# Patient Record
Sex: Male | Born: 1950 | Race: White | Hispanic: No | Marital: Married | Smoking: Current every day smoker
Health system: Southern US, Community
[De-identification: ages and names within clinical notes are randomized; demographics above are authoritative.]

---

## 2002-07-28 ENCOUNTER — Ambulatory Visit (HOSPITAL_COMMUNITY): Admission: RE | Admit: 2002-07-28 | Discharge: 2002-07-28 | Payer: Self-pay | Admitting: Neurosurgery

## 2002-07-28 ENCOUNTER — Encounter: Payer: Self-pay | Admitting: Neurosurgery

## 2007-11-07 ENCOUNTER — Encounter: Admission: RE | Admit: 2007-11-07 | Discharge: 2007-11-25 | Payer: Self-pay | Admitting: Family Medicine

## 2010-07-14 ENCOUNTER — Other Ambulatory Visit: Payer: Self-pay | Admitting: Neurosurgery

## 2010-07-14 DIAGNOSIS — M545 Low back pain: Secondary | ICD-10-CM

## 2010-07-18 ENCOUNTER — Ambulatory Visit
Admission: RE | Admit: 2010-07-18 | Discharge: 2010-07-18 | Disposition: A | Discharge status: Home / Self Care | Payer: 59 | Source: Ambulatory Visit | Attending: Neurosurgery | Admitting: Neurosurgery

## 2010-07-18 DIAGNOSIS — M545 Low back pain: Secondary | ICD-10-CM

## 2019-07-05 ENCOUNTER — Ambulatory Visit: Payer: Self-pay | Attending: Internal Medicine

## 2019-07-05 DIAGNOSIS — Z23 Encounter for immunization: Secondary | ICD-10-CM | POA: Insufficient documentation

## 2019-07-05 NOTE — Progress Notes (Signed)
   Covid-19 Vaccination Clinic  Name:  Zachary Terry    MRN: [id number] DOB: [birth year]  07/05/2019  Mr. Zachary Terry was observed post Covid-19 immunization for 15 minutes without incidence. He was provided with Vaccine Information Sheet and instruction to access the V-Safe system.   Mr. Zachary Terry was instructed to call 911 with any severe reactions post vaccine: Marland Kitchen Difficulty breathing  . Swelling of your face and throat  . A fast heartbeat  . A bad rash all over your body  . Dizziness and weakness    Immunizations Administered    Name Date Dose VIS Date Route   Pfizer COVID-19 Vaccine 07/05/2019  3:28 PM 0.3 mL 05/23/2019 Intramuscular   Manufacturer: [employer], [employer]   Lot: [id number]   NDC: [address]-7

## 2019-07-28 ENCOUNTER — Ambulatory Visit: Payer: Self-pay | Attending: Internal Medicine

## 2019-07-28 DIAGNOSIS — Z23 Encounter for immunization: Secondary | ICD-10-CM | POA: Insufficient documentation

## 2019-07-28 NOTE — Progress Notes (Signed)
   Covid-19 Vaccination Clinic  Name:  VALOR QUAINTANCE    MRN: [id number] DOB: [birth year]  07/28/2019  Mr. Berthelot was observed post Covid-19 immunization for 15 minutes without incidence. He was provided with Vaccine Information Sheet and instruction to access the V-Safe system.   Mr. Malcomb was instructed to call 911 with any severe reactions post vaccine: Marland Kitchen Difficulty breathing  . Swelling of your face and throat  . A fast heartbeat  . A bad rash all over your body  . Dizziness and weakness    Immunizations Administered    Name Date Dose VIS Date Route   Pfizer COVID-19 Vaccine 07/28/2019 11:16 AM 0.3 mL 05/23/2019 Intramuscular   Manufacturer: [employer], [employer]   Lot: [id number]   NDC: [address]-9

## 2019-08-25 ENCOUNTER — Other Ambulatory Visit: Payer: Self-pay | Admitting: Family Medicine

## 2019-08-26 ENCOUNTER — Telehealth: Payer: Self-pay

## 2019-08-26 ENCOUNTER — Other Ambulatory Visit: Payer: Self-pay | Admitting: Family Medicine

## 2019-08-26 NOTE — Telephone Encounter (Signed)
NOTES ON FILE FROM [facility] [phone], REFERRAL SENT TO SCHEDULING

## 2019-09-01 ENCOUNTER — Other Ambulatory Visit: Payer: Self-pay | Admitting: Family Medicine

## 2019-09-01 DIAGNOSIS — Z0000 Encounter for general adult medical examination without abnormal findings: Secondary | ICD-10-CM

## 2019-09-17 ENCOUNTER — Ambulatory Visit: Payer: Self-pay

## 2019-09-19 ENCOUNTER — Ambulatory Visit
Admission: RE | Admit: 2019-09-19 | Discharge: 2019-09-19 | Disposition: A | Discharge status: Home / Self Care | Payer: Medicare Other | Source: Ambulatory Visit | Attending: Family Medicine | Admitting: Family Medicine

## 2019-09-19 DIAGNOSIS — Z0000 Encounter for general adult medical examination without abnormal findings: Secondary | ICD-10-CM

## 2019-12-05 ENCOUNTER — Other Ambulatory Visit: Payer: Self-pay

## 2019-12-05 ENCOUNTER — Encounter: Payer: Self-pay | Admitting: Cardiology

## 2019-12-05 ENCOUNTER — Ambulatory Visit: Payer: Medicare Other | Admitting: Cardiology

## 2019-12-05 ENCOUNTER — Ambulatory Visit
Admission: RE | Admit: 2019-12-05 | Discharge: 2019-12-05 | Disposition: A | Discharge status: Home / Self Care | Payer: Medicare Other | Source: Ambulatory Visit | Attending: Cardiology | Admitting: Cardiology

## 2019-12-05 VITALS — BP 166/96 | HR 78 | Ht 71.5 in | Wt 181.0 lb

## 2019-12-05 DIAGNOSIS — I714 Abdominal aortic aneurysm, without rupture, unspecified: Secondary | ICD-10-CM

## 2019-12-05 DIAGNOSIS — I10 Essential (primary) hypertension: Secondary | ICD-10-CM

## 2019-12-05 DIAGNOSIS — F17200 Nicotine dependence, unspecified, uncomplicated: Secondary | ICD-10-CM

## 2019-12-05 DIAGNOSIS — E785 Hyperlipidemia, unspecified: Secondary | ICD-10-CM

## 2019-12-05 MED ORDER — VALSARTAN 80 MG PO TABS: 80.0000 mg | ORAL_TABLET | Freq: Every day | ORAL | 1 refills | Status: DC

## 2019-12-05 MED ORDER — ROSUVASTATIN CALCIUM 5 MG PO TABS: 5.0000 mg | ORAL_TABLET | Freq: Every day | ORAL | 1 refills | Status: DC

## 2019-12-05 NOTE — Patient Instructions (Signed)
Medication Instructions:   START TAKING VALSARTAN 80 MG BY MOUTH DAILY  START TAKING ROSUVASTATIN 5 MG BY MOUTH DAILY  *If you need a refill on your cardiac medications before your next appointment, please call your pharmacy*   Lab Work:  IN 4 WEEKS HERE AT THE OFFICE TO CHECK--CMET AND LIPIDS--PLEASE COME FASTING TO THIS LAB APPOINTMENT  If you have labs (blood work) drawn today and your tests are completely normal, you will receive your results only by: Marland Kitchen MyChart Message (if you have MyChart) OR . A paper copy in the mail If you have any lab test that is abnormal or we need to change your treatment, we will call you to review the results.  Testing/Procedures:  Your physician has requested that you have an echocardiogram. Echocardiography is a painless test that uses sound waves to create images of your heart. It provides your doctor with information about the size and shape of your heart and how well your heart's chambers and valves are working. This procedure takes approximately one hour. There are no restrictions for this procedure.  CT CALCIUM SCORE TO BE DONE HERE IN THE OFFICE TODAY--OK PER CT TO HAVE THIS DONE AFTER CHECKOUT COMPLETE   Follow-Up:  3 MONTHS IN THE OFFICE TO SEE DR. Delton See

## 2019-12-05 NOTE — Progress Notes (Signed)
Cardiology Office Note:    Date:  12/05/2019   ID:  Zachary Terry, DOB [birth year], MRN [id number]  PCP:  Lawerance Cruel, MD  [place] Cardiologist:  No primary care provider on file.  [facility] Electrophysiologist:  None   Referring MD: Lawerance Cruel, MD   Reason for visit: Hypertension, hyperlipidemia newly diagnosed AAA  History of Present Illness:    Zachary Terry is a 69 y.o. male with a hx of lifelong smoking, previously cigarettes now few cigars a day.  Hyperlipidemia and hypertension who was recently screened for AAA and was found to have ectatic abdominal aorta with diffuse atherosclerotic plaque.  The patient is very active he just recently retired but keeps playing golf several times a week and has no symptoms of chest pain or shortness of breath.  He denies any dizziness palpitations or syncope.  He has never been treated for high blood pressure but previously was on Lipitor and Crestor and tolerated well but did not want to be on long-term statins.  He has no family history of premature coronary artery disease or sudden cardiac death.  His father was 36 he still alive and playing 18 holes several times a week.  He has atrial fibrillation.  Past medical history: Hypertension hyperlipidemia  Current Medications: Current Meds  Medication Sig  . Ascorbic Acid (VITAMIN C PO) Take 1 tablet by mouth daily.  Marland Kitchen aspirin EC 81 MG tablet Take 81 mg by mouth daily. Swallow whole.  . milk thistle 175 MG tablet Take 175 mg by mouth daily.  . sildenafil (REVATIO) 20 MG tablet Take 20 mg by mouth as needed.  Marland Kitchen VITAMIN D PO Take 1 tablet by mouth daily.     Allergies:   Patient has no known allergies.   Social History   Socioeconomic History  . Marital status: Married    Spouse name: Not on file  . Number of children: Not on file  . Years of education: Not on file  . Highest education level: Not on file  Occupational History  . Not on file  Tobacco Use  .  Smoking status: Current Every Day Smoker    Types: Cigars  . Smokeless tobacco: Never Used  Substance and Sexual Activity  . Alcohol use: Not on file  . Drug use: Not on file  . Sexual activity: Not on file  Other Topics Concern  . Not on file  Social History Narrative  . Not on file   Social Determinants of Health   Financial Resource Strain:   . Difficulty of Paying Living Expenses:   Food Insecurity:   . Worried About Charity fundraiser in the Last Year:   . Arboriculturist in the Last Year:   Transportation Needs:   . Film/video editor (Medical):   Marland Kitchen Lack of Transportation (Non-Medical):   Physical Activity:   . Days of Exercise per Week:   . Minutes of Exercise per Session:   Stress:   . Feeling of Stress :   Social Connections:   . Frequency of Communication with Friends and Family:   . Frequency of Social Gatherings with Friends and Family:   . Attends Religious Services:   . Active Member of Clubs or Organizations:   . Attends Archivist Meetings:   Marland Kitchen Marital Status:      Family History: The patient's family history includes atrial fibrillation in his father no family history of premature coronary artery disease.  ROS:   Please see the history of present illness.    All other systems reviewed and are negative.  EKGs/Labs/Other Studies Reviewed:    The following studies were reviewed today: EKG:  EKG is ordered today.  The ekg ordered today demonstrates sinus rhythm, nonspecific ST-T wave abnormalities otherwise normal EKG.  Is in the reviewed.  Recent Labs: No results found for requested labs within last 8760 hours.  Recent Lipid Panel No results found for: CHOL, TRIG, HDL, CHOLHDL, VLDL, LDLCALC, LDLDIRECT  Physical Exam:    VS:  BP (!) 166/96   Pulse 78   Ht 5' 11.5" (1.816 m)   Wt 181 lb (82.1 kg)   SpO2 95%   BMI 24.89 kg/m     Wt Readings from Last 3 Encounters:  12/05/19 181 lb (82.1 kg)    GEN:  Well nourished, well  developed in no acute distress HEENT: Normal NECK: No JVD; No carotid bruits LYMPHATICS: No lymphadenopathy CARDIAC: RRR, no murmurs, rubs, gallops RESPIRATORY:  Clear to auscultation without rales, wheezing or rhonchi  ABDOMEN: Soft, non-tender, non-distended MUSCULOSKELETAL:  No edema; No deformity  SKIN: Warm and dry NEUROLOGIC:  Alert and oriented x 3 PSYCHIATRIC:  Normal affect   ASSESSMENT:    1. Hyperlipidemia, unspecified hyperlipidemia type   2. AAA (abdominal aortic aneurysm) without rupture ([place])   3. Essential hypertension   4. Smoker    PLAN:    In order of problems listed above:  1. Hyperlipidemia -patient has LDL of 117, HDL 61 and triglycerides 58, in the presence of AAA with atherosclerotic disease we will also obtain calcium scoring to evaluate for coronary calcifications, however he should be started on low-dose statin, he agrees to rosuvastatin 5 mg daily.  We will repeat lipids and LFTs in 4 weeks. 2. Hypertension -significantly elevated, he is very fit and active, we will obtain echocardiogram to evaluate for LVEF and degree of LVH as well as diastolic function.  We will start him on valsartan 80 mg daily. 3. AAA -follow-up per vascular, therapy with aspirin and rosuvastatin.   Medication Adjustments/Labs and Tests Ordered: Current medicines are reviewed at length with the patient today.  Concerns regarding medicines are outlined above.  Orders Placed This Encounter  Procedures  . CT CARDIAC SCORING  . Comp Met (CMET)  . Lipid Profile  . EKG 12-Lead  . ECHOCARDIOGRAM COMPLETE   Meds ordered this encounter  Medications  . valsartan (DIOVAN) 80 MG tablet    Sig: Take 1 tablet (80 mg total) by mouth daily.    Dispense:  90 tablet    Refill:  1  . rosuvastatin (CRESTOR) 5 MG tablet    Sig: Take 1 tablet (5 mg total) by mouth daily.    Dispense:  90 tablet    Refill:  1    Patient Instructions  Medication Instructions:   START TAKING VALSARTAN 80  MG BY MOUTH DAILY  START TAKING ROSUVASTATIN 5 MG BY MOUTH DAILY  *If you need a refill on your cardiac medications before your next appointment, please call your pharmacy*   Lab Work:  IN 4 [place] CHECK--CMET AND LIPIDS--PLEASE COME FASTING TO THIS LAB APPOINTMENT  If you have labs (blood work) drawn today and your tests are completely normal, you will receive your results only by: Marland Kitchen MyChart Message (if you have MyChart) OR . A paper copy in the mail If you have any lab test that is abnormal or we need  to change your treatment, we will call you to review the results.  Testing/Procedures:  Your physician has requested that you have an echocardiogram. Echocardiography is a painless test that uses sound waves to create images of your heart. It provides your doctor with information about the size and shape of your heart and how well your heart's chambers and valves are working. This procedure takes approximately one hour. There are no restrictions for this procedure.  CT CALCIUM SCORE TO BE DONE HERE IN THE OFFICE TODAY--OK PER CT TO HAVE THIS DONE AFTER CHECKOUT COMPLETE   Follow-Up:  3 MONTHS IN THE OFFICE TO SEE DR. Meda Coffee      Signed, Ena Dawley, MD  12/05/2019 11:05 AM    [place]

## 2020-01-07 ENCOUNTER — Other Ambulatory Visit: Payer: Medicare Other | Admitting: *Deleted

## 2020-01-07 ENCOUNTER — Other Ambulatory Visit: Payer: Self-pay

## 2020-01-07 ENCOUNTER — Ambulatory Visit (HOSPITAL_COMMUNITY): Payer: Medicare Other | Attending: Cardiology

## 2020-01-07 DIAGNOSIS — F17200 Nicotine dependence, unspecified, uncomplicated: Secondary | ICD-10-CM

## 2020-01-07 DIAGNOSIS — I10 Essential (primary) hypertension: Secondary | ICD-10-CM | POA: Diagnosis present

## 2020-01-07 DIAGNOSIS — I714 Abdominal aortic aneurysm, without rupture, unspecified: Secondary | ICD-10-CM

## 2020-01-07 DIAGNOSIS — E785 Hyperlipidemia, unspecified: Secondary | ICD-10-CM | POA: Insufficient documentation

## 2020-01-07 LAB — COMPREHENSIVE METABOLIC PANEL
ALT: 18 IU/L (ref 0–44)
AST: 20 IU/L (ref 0–40)
Albumin/Globulin Ratio: 2.2 (ref 1.2–2.2)
Albumin: 4.4 g/dL (ref 3.8–4.8)
Alkaline Phosphatase: 56 IU/L (ref 48–121)
BUN/Creatinine Ratio: 15 (ref 10–24)
BUN: 13 mg/dL (ref 8–27)
Bilirubin Total: 0.8 mg/dL (ref 0.0–1.2)
CO2: 23 mmol/L (ref 20–29)
Calcium: 9.4 mg/dL (ref 8.6–10.2)
Chloride: 104 mmol/L (ref 96–106)
Creatinine, Ser: 0.89 mg/dL (ref 0.76–1.27)
GFR calc Af Amer: 100 mL/min/{1.73_m2} (ref >59)
GFR calc non Af Amer: 87 mL/min/{1.73_m2} (ref >59)
Globulin, Total: 2 g/dL (ref 1.5–4.5)
Glucose: 85 mg/dL (ref 65–99)
Potassium: 4.6 mmol/L (ref 3.5–5.2)
Sodium: 141 mmol/L (ref 134–144)
Total Protein: 6.4 g/dL (ref 6.0–8.5)

## 2020-01-07 LAB — LIPID PANEL
Chol/HDL Ratio: 2.7 ratio (ref 0.0–5.0)
Cholesterol, Total: 144 mg/dL (ref 100–199)
HDL: 53 mg/dL (ref >39)
LDL Chol Calc (NIH): 78 mg/dL (ref 0–99)
Triglycerides: 66 mg/dL (ref 0–149)
VLDL Cholesterol Cal: 13 mg/dL (ref 5–40)

## 2020-01-07 LAB — ECHOCARDIOGRAM COMPLETE
Area-P 1/2: 2.63 cm2
S' Lateral: 4.4 cm

## 2020-01-08 ENCOUNTER — Telehealth: Payer: Self-pay | Admitting: *Deleted

## 2020-01-08 DIAGNOSIS — E785 Hyperlipidemia, unspecified: Secondary | ICD-10-CM

## 2020-01-08 DIAGNOSIS — F17200 Nicotine dependence, unspecified, uncomplicated: Secondary | ICD-10-CM

## 2020-01-08 DIAGNOSIS — I714 Abdominal aortic aneurysm, without rupture, unspecified: Secondary | ICD-10-CM

## 2020-01-08 MED ORDER — ROSUVASTATIN CALCIUM 10 MG PO TABS: 10.0000 mg | ORAL_TABLET | Freq: Every day | ORAL | 0 refills | Status: DC

## 2020-01-08 NOTE — Telephone Encounter (Signed)
Pt made aware of lab results and recommendations per Dr. Mayford Knife, covering for Dr. Delton See. Confirmed the pharmacy of choice with the pt.  Note placed to pharmacy that he will call for refills of increased rosuvastatin 10 mg po daily, for he will use his supply on hand at this time.  Scheduled him for repeat labs to check ALT and Lipids in 6 weeks on 02/19/20.  Pt aware to come fasting.  Pt verbalized understanding and agrees with this plan.

## 2020-01-08 NOTE — Telephone Encounter (Signed)
-----   Message from Quintella Reichert, MD sent at 01/07/2020  7:41 PM EDT ----- Would like to see LDL < 70 in setting of aortic atherosclerosis.  Increase Crestor to 10mg  daily and repeat FLP and ALT in 6 weeks

## 2020-02-04 ENCOUNTER — Other Ambulatory Visit: Payer: Self-pay

## 2020-02-04 DIAGNOSIS — I714 Abdominal aortic aneurysm, without rupture, unspecified: Secondary | ICD-10-CM

## 2020-02-04 DIAGNOSIS — F17200 Nicotine dependence, unspecified, uncomplicated: Secondary | ICD-10-CM

## 2020-02-04 DIAGNOSIS — E785 Hyperlipidemia, unspecified: Secondary | ICD-10-CM

## 2020-02-04 MED ORDER — ROSUVASTATIN CALCIUM 10 MG PO TABS: 10.0000 mg | ORAL_TABLET | Freq: Every day | ORAL | 2 refills | Status: DC

## 2020-02-04 NOTE — Telephone Encounter (Signed)
Pt's medication was sent to pt's pharmacy as requested. Confirmation received.  °

## 2020-02-19 ENCOUNTER — Other Ambulatory Visit: Payer: Medicare Other | Admitting: *Deleted

## 2020-02-19 ENCOUNTER — Other Ambulatory Visit: Payer: Self-pay

## 2020-02-19 DIAGNOSIS — E785 Hyperlipidemia, unspecified: Secondary | ICD-10-CM

## 2020-02-19 DIAGNOSIS — I714 Abdominal aortic aneurysm, without rupture, unspecified: Secondary | ICD-10-CM

## 2020-02-19 DIAGNOSIS — F17200 Nicotine dependence, unspecified, uncomplicated: Secondary | ICD-10-CM

## 2020-02-19 LAB — LIPID PANEL
Chol/HDL Ratio: 2.8 ratio (ref 0.0–5.0)
Cholesterol, Total: 156 mg/dL (ref 100–199)
HDL: 55 mg/dL (ref >39)
LDL Chol Calc (NIH): 84 mg/dL (ref 0–99)
Triglycerides: 92 mg/dL (ref 0–149)
VLDL Cholesterol Cal: 17 mg/dL (ref 5–40)

## 2020-02-19 LAB — ALT: ALT: 19 IU/L (ref 0–44)

## 2020-03-10 ENCOUNTER — Ambulatory Visit: Payer: Medicare Other | Admitting: Cardiology

## 2020-03-10 ENCOUNTER — Encounter: Payer: Self-pay | Admitting: Cardiology

## 2020-03-10 ENCOUNTER — Other Ambulatory Visit: Payer: Self-pay

## 2020-03-10 VITALS — BP 152/84 | HR 68 | Ht 71.0 in | Wt 182.0 lb

## 2020-03-10 DIAGNOSIS — I10 Essential (primary) hypertension: Secondary | ICD-10-CM

## 2020-03-10 DIAGNOSIS — E785 Hyperlipidemia, unspecified: Secondary | ICD-10-CM

## 2020-03-10 DIAGNOSIS — I2510 Atherosclerotic heart disease of native coronary artery without angina pectoris: Secondary | ICD-10-CM | POA: Diagnosis not present

## 2020-03-10 DIAGNOSIS — I714 Abdominal aortic aneurysm, without rupture, unspecified: Secondary | ICD-10-CM

## 2020-03-10 DIAGNOSIS — I2584 Coronary atherosclerosis due to calcified coronary lesion: Secondary | ICD-10-CM

## 2020-03-10 MED ORDER — VALSARTAN 160 MG PO TABS
160.0000 mg | ORAL_TABLET | Freq: Every day | ORAL | 3 refills | Status: DC
Start: 2020-03-10 — End: 2021-04-12

## 2020-03-10 NOTE — Patient Instructions (Addendum)
Medication Instructions:   INCREASE YOUR VALSARTAN TO 160 MG BY MOUTH DAILY  *If you need a refill on your cardiac medications before your next appointment, please call your pharmacy*    Follow-Up: At [facility], you and your health needs are our priority.  As part of our continuing mission to provide you with exceptional heart care, we have created designated Provider Care Teams.  These Care Teams include your primary Cardiologist (physician) and Advanced Practice Providers (APPs -  Physician Assistants and Nurse Practitioners) who all work together to provide you with the care you need, when you need it.  We recommend signing up for the patient portal called "MyChart".  Sign up information is provided on this After Visit Summary.  MyChart is used to connect with patients for Virtual Visits (Telemedicine).  Patients are able to view lab/test results, encounter notes, upcoming appointments, etc.  Non-urgent messages can be sent to your provider as well.   To learn more about what you can do with MyChart, go to [email or web address].    Your next appointment:   12 month(s)  The format for your next appointment:   In Person  Provider:   Tobias Alexander, MD   PLEASE TAKE YOUR BLOOD PRESSURE FOR THE NEXT SEVERAL WEEKS, LOG THIS, AND SEND THE READINGS TO DR. Delton See VIA [facility]

## 2020-03-10 NOTE — Progress Notes (Signed)
Cardiology Office Note:    Date:  03/10/2020   ID:  [name], DOB 13-Mar-1951, MRN [id number]  PCP:  Daisy Floro, MD  [facility] Cardiologist:  No primary care provider on file.  [facility] Electrophysiologist:  None   Referring MD: Daisy Floro, MD   Reason for visit: 3 months follow-up for hypertension  History of Present Illness:    Zachary Terry is a 69 y.o. male with a hx of lifelong smoking, previously cigarettes now few cigars a day.  Hyperlipidemia and hypertension who was recently screened for AAA and was found to have ectatic abdominal aorta with diffuse atherosclerotic plaque.  The patient is very active he just recently retired but keeps playing golf several times a week and has no symptoms of chest pain or shortness of breath.  He denies any dizziness palpitations or syncope.  He has never been treated for high blood pressure but previously was on Lipitor and Crestor and tolerated well but did not want to be on long-term statins.  He has no family history of premature coronary artery disease or sudden cardiac death.  His father was 8 he still alive and playing 18 holes several times a week.  He has atrial fibrillation.  Past medical history: Hypertension hyperlipidemia  The patient comes after 3 months, he states he has been feeling great, he is able to play golf and has no symptoms of chest pain or shortness of breath.  He has been tolerating his medications well.  He brings a blood pressure diary with blood pressures ranging from 1 30-1 50s and diastolic always in 80s.  Current Medications: Current Meds  Medication Sig  . Ascorbic Acid (VITAMIN C PO) Take 1 tablet by mouth daily.  Marland Kitchen aspirin EC 81 MG tablet Take 81 mg by mouth daily. Swallow whole.  . milk thistle 175 MG tablet Take 175 mg by mouth daily.  . rosuvastatin (CRESTOR) 10 MG tablet Take 1 tablet (10 mg total) by mouth daily.  . sildenafil (REVATIO) 20 MG tablet Take 20 mg by mouth as  needed.  . valsartan (DIOVAN) 80 MG tablet Take 1 tablet (80 mg total) by mouth daily.  Marland Kitchen VITAMIN D PO Take 1 tablet by mouth daily.     Allergies:   Patient has no known allergies.   Social History   Socioeconomic History  . Marital status: Married    Spouse name: Not on file  . Number of children: Not on file  . Years of education: Not on file  . Highest education level: Not on file  Occupational History  . Not on file  Tobacco Use  . Smoking status: Current Every Day Smoker    Types: Cigars  . Smokeless tobacco: Never Used  Substance and Sexual Activity  . Alcohol use: Not on file  . Drug use: Not on file  . Sexual activity: Not on file  Other Topics Concern  . Not on file  Social History Narrative  . Not on file   Social Determinants of Health   Financial Resource Strain:   . Difficulty of Paying Living Expenses: Not on file  Food Insecurity:   . Worried About Programme researcher, broadcasting/film/video in the Last Year: Not on file  . Ran Out of Food in the Last Year: Not on file  Transportation Needs:   . Lack of Transportation (Medical): Not on file  . Lack of Transportation (Non-Medical): Not on file  Physical Activity:   .  Days of Exercise per Week: Not on file  . Minutes of Exercise per Session: Not on file  Stress:   . Feeling of Stress : Not on file  Social Connections:   . Frequency of Communication with Friends and Family: Not on file  . Frequency of Social Gatherings with Friends and Family: Not on file  . Attends Religious Services: Not on file  . Active Member of Clubs or Organizations: Not on file  . Attends Banker Meetings: Not on file  . Marital Status: Not on file     Family History: The patient's family history includes atrial fibrillation in his father no family history of premature coronary artery disease.  ROS:   Please see the history of present illness.    All other systems reviewed and are negative.  EKGs/Labs/Other Studies Reviewed:     The following studies were reviewed today: EKG:  EKG is ordered today.  The ekg ordered today demonstrates sinus rhythm, nonspecific ST-T wave abnormalities otherwise normal EKG.  Is in the reviewed.  Recent Labs: 01/07/2020: BUN 13; Creatinine, Ser 0.89; Potassium 4.6; Sodium 141 02/19/2020: ALT 19  Recent Lipid Panel    Component Value Date/Time   CHOL 156 02/19/2020 0915   TRIG 92 02/19/2020 0915   HDL 55 02/19/2020 0915   CHOLHDL 2.8 02/19/2020 0915   LDLCALC 84 02/19/2020 0915    Physical Exam:    VS:  BP (!) 152/84   Pulse 68   Ht 5\' 11"  (1.803 m)   Wt 182 lb (82.6 kg)   SpO2 95%   BMI 25.38 kg/m     Wt Readings from Last 3 Encounters:  03/10/20 182 lb (82.6 kg)  12/05/19 181 lb (82.1 kg)    GEN:  Well nourished, well developed in no acute distress HEENT: Normal NECK: No JVD; No carotid bruits LYMPHATICS: No lymphadenopathy CARDIAC: RRR, no murmurs, rubs, gallops RESPIRATORY:  Clear to auscultation without rales, wheezing or rhonchi  ABDOMEN: Soft, non-tender, non-distended MUSCULOSKELETAL:  No edema; No deformity  SKIN: Warm and dry NEUROLOGIC:  Alert and oriented x 3 PSYCHIATRIC:  Normal affect   ASSESSMENT:    1. Hyperlipidemia, unspecified hyperlipidemia type   2. Essential hypertension   3. AAA (abdominal aortic aneurysm) without rupture ([facility])   4. Coronary artery calcification    PLAN:    In order of problems listed above:  1. Hyperlipidemia -the patient was started on 10 mg of rosuvastatin with elevated LDL as well as AAA and evidence of coronary calcifications.  He is repeat lipids are at goal with LDL of 84 and HDL 55, triglycerides 92.  We will continue to same management.  2. Hypertension -his blood pressure is still elevated, there is evidence of mild LVH on his echocardiogram, will increase his valsartan from 80 to 160 mg daily.   3. AAA -follow-up per vascular, therapy with aspirin and rosuvastatin.   Medication Adjustments/Labs and Tests  Ordered: Current medicines are reviewed at length with the patient today.  Concerns regarding medicines are outlined above.  No orders of the defined types were placed in this encounter.  No orders of the defined types were placed in this encounter.   Patient Instructions  Medication Instructions:   Your physician recommends that you continue on your current medications as directed. Please refer to the Current Medication list given to you today.  *If you need a refill on your cardiac medications before your next appointment, please call your pharmacy*  Follow-Up: At [facility], you and your health needs are our priority.  As part of our continuing mission to provide you with exceptional heart care, we have created designated Provider Care Teams.  These Care Teams include your primary Cardiologist (physician) and Advanced Practice Providers (APPs -  Physician Assistants and Nurse Practitioners) who all work together to provide you with the care you need, when you need it.  We recommend signing up for the patient portal called "MyChart".  Sign up information is provided on this After Visit Summary.  MyChart is used to connect with patients for Virtual Visits (Telemedicine).  Patients are able to view lab/test results, encounter notes, upcoming appointments, etc.  Non-urgent messages can be sent to your provider as well.   To learn more about what you can do with MyChart, go to [email or web address].    Your next appointment:   12 month(s)  The format for your next appointment:   In Person  Provider:   Tobias Alexander, MD        Signed, Tobias Alexander, MD  03/10/2020 9:56 AM    [facility]

## 2020-04-10 ENCOUNTER — Ambulatory Visit: Payer: Medicare Other | Attending: Internal Medicine

## 2020-04-10 DIAGNOSIS — Z23 Encounter for immunization: Secondary | ICD-10-CM

## 2020-04-10 NOTE — Progress Notes (Signed)
   Covid-19 Vaccination Clinic  Name:  Zachary Terry    MRN: [id number] DOB: 1951/05/06  04/10/2020  [name] was observed post Covid-19 immunization for 15 minutes without incident. He was provided with Vaccine Information Sheet and instruction to access the V-Safe system.   [name] was instructed to call 911 with any severe reactions post vaccine: Marland Kitchen Difficulty breathing  . Swelling of face and throat  . A fast heartbeat  . A bad rash all over body  . Dizziness and weakness

## 2020-05-24 ENCOUNTER — Other Ambulatory Visit: Payer: Self-pay | Admitting: Cardiology

## 2020-05-24 DIAGNOSIS — E785 Hyperlipidemia, unspecified: Secondary | ICD-10-CM

## 2020-05-24 DIAGNOSIS — I714 Abdominal aortic aneurysm, without rupture, unspecified: Secondary | ICD-10-CM

## 2020-05-24 DIAGNOSIS — F17200 Nicotine dependence, unspecified, uncomplicated: Secondary | ICD-10-CM

## 2020-05-24 DIAGNOSIS — I10 Essential (primary) hypertension: Secondary | ICD-10-CM

## 2020-08-16 MED ORDER — HYDROCHLOROTHIAZIDE 25 MG PO TABS
25.0000 mg | ORAL_TABLET | Freq: Every day | ORAL | 1 refills | Status: DC
Start: 2020-08-16 — End: 2021-02-07

## 2020-08-16 NOTE — Telephone Encounter (Signed)
Zachary Masson, MD  You; Lendon Ka, RN 2 days ago     These numbers are high, I would recommend to add hydrochlorothiazide 25 mg po daily, please send [place] BP measurements once on this medication, thank you, KN     Made the pt aware of Dr. Lindaann Slough recommendations for him to start new med HCTZ 25 mg po daily, for BP reading he sent for her to review, via mychart message.  Advised the pt to start taking HCTZ 25 mg po daily, and continue to take his BP once on this medication, and send those to [place] via mychart for Dr. Delton See to review, in the next week and half or 2 weeks.  Endorsed to the pt that this new rx has been sent into his pharmacy on file.  Advised the pt to mychart [place] back or call the office back with any further questions or concerns regarding these recommendations.

## 2020-08-24 DIAGNOSIS — Z136 Encounter for screening for cardiovascular disorders: Secondary | ICD-10-CM | POA: Diagnosis not present

## 2020-08-24 DIAGNOSIS — Z131 Encounter for screening for diabetes mellitus: Secondary | ICD-10-CM | POA: Diagnosis not present

## 2020-08-30 DIAGNOSIS — Z0000 Encounter for general adult medical examination without abnormal findings: Secondary | ICD-10-CM | POA: Diagnosis not present

## 2020-08-30 DIAGNOSIS — E7800 Pure hypercholesterolemia, unspecified: Secondary | ICD-10-CM | POA: Diagnosis not present

## 2020-08-30 DIAGNOSIS — Z79899 Other long term (current) drug therapy: Secondary | ICD-10-CM | POA: Diagnosis not present

## 2020-09-08 DIAGNOSIS — M19071 Primary osteoarthritis, right ankle and foot: Secondary | ICD-10-CM | POA: Diagnosis not present

## 2020-11-03 DIAGNOSIS — I10 Essential (primary) hypertension: Secondary | ICD-10-CM | POA: Diagnosis not present

## 2020-11-03 DIAGNOSIS — S161XXA Strain of muscle, fascia and tendon at neck level, initial encounter: Secondary | ICD-10-CM | POA: Diagnosis not present

## 2020-11-09 DIAGNOSIS — S161XXD Strain of muscle, fascia and tendon at neck level, subsequent encounter: Secondary | ICD-10-CM | POA: Diagnosis not present

## 2020-11-09 DIAGNOSIS — M542 Cervicalgia: Secondary | ICD-10-CM | POA: Diagnosis not present

## 2020-11-12 ENCOUNTER — Telehealth: Payer: Self-pay | Admitting: Cardiology

## 2020-11-12 DIAGNOSIS — I714 Abdominal aortic aneurysm, without rupture, unspecified: Secondary | ICD-10-CM

## 2020-11-12 DIAGNOSIS — E785 Hyperlipidemia, unspecified: Secondary | ICD-10-CM

## 2020-11-12 DIAGNOSIS — F17200 Nicotine dependence, unspecified, uncomplicated: Secondary | ICD-10-CM

## 2020-11-12 NOTE — Telephone Encounter (Signed)
*  STAT* If patient is at the pharmacy, call can be transferred to refill team.   1. Which medications need to be refilled? (please list name of each medication and dose if known)  rosuvastatin (CRESTOR) 10 MG tablet  2. Which pharmacy/location (including street and city if local pharmacy) is medication to be sent to? [id number] - [place], [place] - [address] AT [address]  3. Do they need a 30 day or 90 day supply? 90 day supply  Patient's wife states the patient is completely out of medication.

## 2020-11-15 MED ORDER — ROSUVASTATIN CALCIUM 10 MG PO TABS: 10.0000 mg | ORAL_TABLET | Freq: Every day | ORAL | 0 refills | Status: DC

## 2020-11-15 NOTE — Telephone Encounter (Signed)
Pt's medication was sent to pt's pharmacy as requested. Confirmation received.  °

## 2020-11-15 NOTE — Telephone Encounter (Signed)
Fill it under Dr. Shari Prows or Gillian Shields NP with our office.  She will be seeing him at [facility] location in Sept for his yearly.

## 2020-11-15 NOTE — Telephone Encounter (Signed)
Pt is requesting a refill on rosuvastatin. Does pt supposed to follow up with Dr. Shari Prows? Who will pt be seeing to be able to get a refill. Please advise

## 2020-11-16 DIAGNOSIS — M542 Cervicalgia: Secondary | ICD-10-CM | POA: Diagnosis not present

## 2020-11-16 DIAGNOSIS — S161XXD Strain of muscle, fascia and tendon at neck level, subsequent encounter: Secondary | ICD-10-CM | POA: Diagnosis not present

## 2020-11-23 DIAGNOSIS — S161XXD Strain of muscle, fascia and tendon at neck level, subsequent encounter: Secondary | ICD-10-CM | POA: Diagnosis not present

## 2020-11-23 DIAGNOSIS — M542 Cervicalgia: Secondary | ICD-10-CM | POA: Diagnosis not present

## 2020-12-01 DIAGNOSIS — I10 Essential (primary) hypertension: Secondary | ICD-10-CM | POA: Diagnosis not present

## 2020-12-01 DIAGNOSIS — M5412 Radiculopathy, cervical region: Secondary | ICD-10-CM | POA: Diagnosis not present

## 2020-12-07 DIAGNOSIS — M50122 Cervical disc disorder at C5-C6 level with radiculopathy: Secondary | ICD-10-CM | POA: Diagnosis not present

## 2020-12-07 DIAGNOSIS — M4722 Other spondylosis with radiculopathy, cervical region: Secondary | ICD-10-CM | POA: Diagnosis not present

## 2020-12-07 DIAGNOSIS — M4802 Spinal stenosis, cervical region: Secondary | ICD-10-CM | POA: Diagnosis not present

## 2020-12-07 DIAGNOSIS — M5412 Radiculopathy, cervical region: Secondary | ICD-10-CM | POA: Diagnosis not present

## 2020-12-20 DIAGNOSIS — I10 Essential (primary) hypertension: Secondary | ICD-10-CM | POA: Diagnosis not present

## 2020-12-20 DIAGNOSIS — M5412 Radiculopathy, cervical region: Secondary | ICD-10-CM | POA: Diagnosis not present

## 2020-12-30 DIAGNOSIS — M5412 Radiculopathy, cervical region: Secondary | ICD-10-CM | POA: Diagnosis not present

## 2021-01-06 ENCOUNTER — Other Ambulatory Visit: Payer: Self-pay | Admitting: *Deleted

## 2021-01-06 DIAGNOSIS — F17210 Nicotine dependence, cigarettes, uncomplicated: Secondary | ICD-10-CM

## 2021-01-06 DIAGNOSIS — Z87891 Personal history of nicotine dependence: Secondary | ICD-10-CM

## 2021-01-06 NOTE — Progress Notes (Signed)
[place]

## 2021-01-18 DIAGNOSIS — H35032 Hypertensive retinopathy, left eye: Secondary | ICD-10-CM | POA: Diagnosis not present

## 2021-01-18 DIAGNOSIS — H52223 Regular astigmatism, bilateral: Secondary | ICD-10-CM | POA: Diagnosis not present

## 2021-01-18 DIAGNOSIS — H5203 Hypermetropia, bilateral: Secondary | ICD-10-CM | POA: Diagnosis not present

## 2021-01-18 DIAGNOSIS — Z9849 Cataract extraction status, unspecified eye: Secondary | ICD-10-CM | POA: Diagnosis not present

## 2021-01-18 DIAGNOSIS — H43393 Other vitreous opacities, bilateral: Secondary | ICD-10-CM | POA: Diagnosis not present

## 2021-01-18 DIAGNOSIS — H524 Presbyopia: Secondary | ICD-10-CM | POA: Diagnosis not present

## 2021-01-18 DIAGNOSIS — Z961 Presence of intraocular lens: Secondary | ICD-10-CM | POA: Diagnosis not present

## 2021-01-20 DIAGNOSIS — M5412 Radiculopathy, cervical region: Secondary | ICD-10-CM | POA: Diagnosis not present

## 2021-02-07 ENCOUNTER — Other Ambulatory Visit: Payer: Self-pay

## 2021-02-07 MED ORDER — HYDROCHLOROTHIAZIDE 25 MG PO TABS: 25.0000 mg | ORAL_TABLET | Freq: Every day | ORAL | 0 refills | Status: DC

## 2021-02-09 NOTE — Progress Notes (Signed)
Virtual Visit via Telephone Note  I connected with Zachary Terry on 02/10/21 at  9:00 AM EDT by telephone and verified that I am speaking with the correct person using two identifiers.  Location: Patient: Home Provider: Office   I discussed the limitations, risks, security and privacy concerns of performing an evaluation and management service by telephone and the availability of in person appointments. I also discussed with the patient that there may be a patient responsible charge related to this service. The patient expressed understanding and agreed to proceed.   Shared Decision Making Visit Lung Cancer Screening Program [id number])   Eligibility: Age 70 y.o. Pack Years Smoking History Calculation 60 (# packs/per year x # years smoked) Recent History of coughing up blood  no Unexplained weight loss? no ( >Than 15 pounds within the last 6 months ) Prior History Lung / other cancer no (Diagnosis within the last 5 years already requiring surveillance chest CT Scans). Smoking Status Current Smoker Former Smokers: Years since quit: < 1 year  Quit Date: NA  Visit Components: Discussion included one or more decision making aids. yes Discussion included risk/benefits of screening. yes Discussion included potential follow up diagnostic testing for abnormal scans. yes Discussion included meaning and risk of over diagnosis. yes Discussion included meaning and risk of False Positives. yes Discussion included meaning of total radiation exposure. yes  Counseling Included: Importance of adherence to annual lung cancer LDCT screening. yes Impact of comorbidities on ability to participate in the program. yes Ability and willingness to under diagnostic treatment. yes  Smoking Cessation Counseling: Current Smokers:  Discussed importance of smoking cessation. yes Information about tobacco cessation classes and interventions provided to patient. yes Patient provided with "ticket" for  LDCT Scan. yes Symptomatic Patient. no  Counseling(Intermediate counseling: > three minutes) 99406 Diagnosis Code: Tobacco Use Z72.0 Asymptomatic Patient yes  Counseling (Intermediate counseling: > three minutes counseling) [id number] Former Smokers:  Discussed the importance of maintaining cigarette abstinence. yes Diagnosis Code: Personal History of Nicotine Dependence. Y65.035 Information about tobacco cessation classes and interventions provided to patient. Yes Patient provided with "ticket" for LDCT Scan. NA/ televisit  Written Order for Lung Cancer Screening with LDCT placed in Epic. Yes (CT Chest Lung Cancer Screening Low Dose W/O CM) [id number] Z12.2-Screening of respiratory organs Z87.891-Personal history of nicotine dependence  I have spent 25 minutes of face to face time with [name] discussing the risks and benefits of lung cancer screening. We viewed a power point together that explained in detail the above noted topics. We paused at intervals to allow for questions to be asked and answered to ensure understanding.We discussed that the single most powerful action that he can take to decrease his risk of developing lung cancer is to quit smoking. We discussed whether or not he is ready to commit to setting a quit date. We discussed options for tools to aid in quitting smoking including nicotine replacement therapy, non-nicotine medications, support groups, Quit Smart classes, and behavior modification. We discussed that often times setting smaller, more achievable goals, such as eliminating 1 cigarette a day for a week and then 2 cigarettes a day for a week can be helpful in slowly decreasing the number of cigarettes smoked. This allows for a sense of accomplishment as well as providing a clinical benefit. I gave him the " Be Stronger Than Your Excuses" card with contact information for community resources, classes, free nicotine replacement therapy, and access to mobile apps, text messaging,  and on-line  smoking cessation help. I have also given him my card and contact information in the event he needs to contact me. We discussed the time and location of the scan, and that either Abigail Miyamoto RN or I will call with the results within 24-48 hours of receiving them. I have offered him  a copy of the power point we viewed  as a resource in the event they need reinforcement of the concepts we discussed today in the office. The patient verbalized understanding of all of  the above and had no further questions upon leaving the office. They have my contact information in the event they have any further questions.  I spent 3 minutes counseling on smoking cessation and the health risks of continued tobacco abuse.  I explained to the patient that there has been a high incidence of coronary artery disease noted on these exams. I explained that this is a non-gated exam therefore degree or severity cannot be determined. This patient is on statin therapy. I have asked the patient to follow-up with their PCP regarding any incidental finding of coronary artery disease and management with diet or medication as their PCP  feels is clinically indicated. The patient verbalized understanding of the above and had no further questions upon completion of the visit.    Glenford Bayley, NP

## 2021-02-09 NOTE — Patient Instructions (Signed)
Thank you for participating in the [facility] Lung Cancer Screening Program. It was our pleasure to meet you today. We will call you with the results of your scan within the next few days. Your scan will be assigned a Lung RADS category score by the physicians reading the scans.  This Lung RADS score determines follow up scanning.  See below for description of categories, and follow up screening recommendations. We will be in touch to schedule your follow up screening annually or based on recommendations of our providers. We will fax a copy of your scan results to your Primary Care Physician, or the physician who referred you to the program, to ensure they have the results. Please call the office if you have any questions or concerns regarding your scanning experience or results.  Our office number is [phone]. Please speak with Denise Phelps, RN. She is our Lung Cancer Screening RN. If she is unavailable when you call, please have the office staff send her a message. She will return your call at her earliest convenience. Remember, if your scan is normal, we will scan you annually as long as you continue to meet the criteria for the program. (Age 55-77, Current smoker or smoker who has quit within the last 15 years). If you are a smoker, remember, quitting is the single most powerful action that you can take to decrease your risk of lung cancer and other pulmonary, breathing related problems. We know quitting is hard, and we are here to help.  Please let us know if there is anything we can do to help you meet your goal of quitting. If you are a former smoker, congratulations. We are proud of you! Remain smoke free! Remember you can refer friends or family members through the number above.  We will screen them to make sure they meet criteria for the program. Thank you for helping us take better care of you by participating in Lung Screening.  Lung RADS Categories:  Lung RADS 1: no nodules  or definitely non-concerning nodules.  Recommendation is for a repeat annual scan in 12 months.  Lung RADS 2:  nodules that are non-concerning in appearance and behavior with a very low likelihood of becoming an active cancer. Recommendation is for a repeat annual scan in 12 months.  Lung RADS 3: nodules that are probably non-concerning , includes nodules with a low likelihood of becoming an active cancer.  Recommendation is for a 6-month repeat screening scan. Often noted after an upper respiratory illness. We will be in touch to make sure you have no questions, and to schedule your 6-month scan.  Lung RADS 4 A: nodules with concerning findings, recommendation is most often for a follow up scan in 3 months or additional testing based on our provider's assessment of the scan. We will be in touch to make sure you have no questions and to schedule the recommended 3 month follow up scan.  Lung RADS 4 B:  indicates findings that are concerning. We will be in touch with you to schedule additional diagnostic testing based on our provider's  assessment of the scan.   

## 2021-02-10 ENCOUNTER — Ambulatory Visit (INDEPENDENT_AMBULATORY_CARE_PROVIDER_SITE_OTHER)
Admission: RE | Admit: 2021-02-10 | Discharge: 2021-02-10 | Disposition: A | Discharge status: Home / Self Care | Payer: Medicare Other | Source: Ambulatory Visit | Attending: Cardiology | Admitting: Cardiology

## 2021-02-10 ENCOUNTER — Encounter: Payer: Self-pay | Admitting: Primary Care

## 2021-02-10 ENCOUNTER — Other Ambulatory Visit: Payer: Self-pay

## 2021-02-10 ENCOUNTER — Telehealth (INDEPENDENT_AMBULATORY_CARE_PROVIDER_SITE_OTHER): Payer: Medicare Other | Admitting: Primary Care

## 2021-02-10 DIAGNOSIS — F17200 Nicotine dependence, unspecified, uncomplicated: Secondary | ICD-10-CM

## 2021-02-10 DIAGNOSIS — Z87891 Personal history of nicotine dependence: Secondary | ICD-10-CM | POA: Diagnosis not present

## 2021-02-10 DIAGNOSIS — F17210 Nicotine dependence, cigarettes, uncomplicated: Secondary | ICD-10-CM

## 2021-02-10 DIAGNOSIS — E7800 Pure hypercholesterolemia, unspecified: Secondary | ICD-10-CM

## 2021-02-10 HISTORY — DX: Pure hypercholesterolemia, unspecified: E78.00

## 2021-02-23 ENCOUNTER — Other Ambulatory Visit: Payer: Self-pay

## 2021-02-23 DIAGNOSIS — I714 Abdominal aortic aneurysm, without rupture, unspecified: Secondary | ICD-10-CM

## 2021-02-23 DIAGNOSIS — F17200 Nicotine dependence, unspecified, uncomplicated: Secondary | ICD-10-CM

## 2021-02-23 DIAGNOSIS — E785 Hyperlipidemia, unspecified: Secondary | ICD-10-CM

## 2021-02-23 MED ORDER — ROSUVASTATIN CALCIUM 10 MG PO TABS: 10.0000 mg | ORAL_TABLET | Freq: Every day | ORAL | 0 refills | Status: DC

## 2021-02-23 NOTE — Telephone Encounter (Signed)
Pt's medication was sent to pt's pharmacy as requested. Confirmation received.  °

## 2021-02-25 ENCOUNTER — Encounter: Payer: Self-pay | Admitting: *Deleted

## 2021-02-25 DIAGNOSIS — Z87891 Personal history of nicotine dependence: Secondary | ICD-10-CM

## 2021-02-25 DIAGNOSIS — F17210 Nicotine dependence, cigarettes, uncomplicated: Secondary | ICD-10-CM

## 2021-03-08 ENCOUNTER — Ambulatory Visit (HOSPITAL_BASED_OUTPATIENT_CLINIC_OR_DEPARTMENT_OTHER): Payer: Medicare Other | Admitting: Family

## 2021-03-18 NOTE — Progress Notes (Signed)
LR 2 . Annual Follow up . Letter sent by Abigail Miyamoto RN on 9/16

## 2021-04-06 DIAGNOSIS — M19071 Primary osteoarthritis, right ankle and foot: Secondary | ICD-10-CM | POA: Diagnosis not present

## 2021-04-08 ENCOUNTER — Ambulatory Visit (HOSPITAL_BASED_OUTPATIENT_CLINIC_OR_DEPARTMENT_OTHER): Payer: Medicare Other | Admitting: Family

## 2021-04-08 ENCOUNTER — Encounter (HOSPITAL_BASED_OUTPATIENT_CLINIC_OR_DEPARTMENT_OTHER): Payer: Self-pay | Admitting: Family

## 2021-04-08 ENCOUNTER — Other Ambulatory Visit: Payer: Self-pay

## 2021-04-08 VITALS — BP 140/90 | HR 55 | Ht 71.5 in | Wt 180.1 lb

## 2021-04-08 DIAGNOSIS — I25118 Atherosclerotic heart disease of native coronary artery with other forms of angina pectoris: Secondary | ICD-10-CM | POA: Diagnosis not present

## 2021-04-08 DIAGNOSIS — I714 Abdominal aortic aneurysm, without rupture, unspecified: Secondary | ICD-10-CM

## 2021-04-08 DIAGNOSIS — E785 Hyperlipidemia, unspecified: Secondary | ICD-10-CM | POA: Diagnosis not present

## 2021-04-08 DIAGNOSIS — I10 Essential (primary) hypertension: Secondary | ICD-10-CM | POA: Diagnosis not present

## 2021-04-08 NOTE — Patient Instructions (Addendum)
Medication Instructions:   Continue your current medications.  We will change your medications based on your lab results to get your blood pressure to less than 130/80.   *If you need a refill on your cardiac medications before your next appointment, please call your pharmacy*   Lab Work: Your physician recommends that you return for lab work today: CMP, lipid panel  If you have labs (blood work) drawn today and your tests are completely normal, you will receive your results only by: MyChart Message (if you have MyChart) OR A paper copy in the mail If you have any lab test that is abnormal or we need to change your treatment, we will call you to review the results.   Testing/Procedures: Your EKG today showed sinus bradycardia which is a normal finding.   Follow-Up: At [facility], you and your health needs are our priority.  As part of our continuing mission to provide you with exceptional heart care, we have created designated Provider Care Teams.  These Care Teams include your primary Cardiologist (physician) and Advanced Practice Providers (APPs -  Physician Assistants and Nurse Practitioners) who all work together to provide you with the care you need, when you need it.  We recommend signing up for the patient portal called "MyChart".  Sign up information is provided on this After Visit Summary.  MyChart is used to connect with patients for Virtual Visits (Telemedicine).  Patients are able to view lab/test results, encounter notes, upcoming appointments, etc.  Non-urgent messages can be sent to your provider as well.   To learn more about what you can do with MyChart, go to [email or web address].    Your next appointment:   6 month(s)  The format for your next appointment:   In Person  Provider:   You may see Meriam Sprague, MD or one of the following Advanced Practice Providers on your designated Care Team:   Tereso Newcomer, PA-C [place], [place]   Other  Instructions  Heart Healthy Diet Recommendations: A low-salt diet is recommended. Meats should be grilled, baked, or boiled. Avoid fried foods. Focus on lean protein sources like fish or chicken with vegetables and fruits. The American Heart Association is a Chief Technology Officer!  American Heart Association Diet and Lifeystyle Recommendations    Exercise recommendations: The American Heart Association recommends 150 minutes of moderate intensity exercise weekly. Try 30 minutes of moderate intensity exercise 4-5 times per week. This could include walking, jogging, or swimming.    Tips to Measure your Blood Pressure Correctly  Here's what you can do to ensure a correct reading:  Don't drink a caffeinated beverage or smoke during the 30 minutes before the test.  Sit quietly for five minutes before the test begins.  During the measurement, sit in a chair with your feet on the floor and your arm supported so your elbow is at about heart level.  The inflatable part of the cuff should completely cover at least 80% of your upper arm, and the cuff should be placed on bare skin, not over a shirt.  Don't talk during the measurement.  Have your blood pressure measured twice, with a brief break in between. If the readings are different by 5 points or more, have it done a third time.  In 2017, new guidelines from the American Heart Association, the [employer] of Cardiology, and nine other health organizations lowered the diagnosis of high blood pressure to 130/80 mm Hg or higher for all adults. The guidelines also  redefined the various blood pressure categories to now include normal, elevated, Stage 1 hypertension, Stage 2 hypertension, and hypertensive crisis (see "Blood pressure categories").  Blood pressure categories  Blood pressure category SYSTOLIC (upper number)  DIASTOLIC (lower number)  Normal Less than 120 mm Hg and Less than 80 mm Hg  Elevated 120-129 mm Hg and Less than 80 mm Hg  High blood  pressure: Stage 1 hypertension 130-139 mm Hg or 80-89 mm Hg  High blood pressure: Stage 2 hypertension 140 mm Hg or higher or 90 mm Hg or higher  Hypertensive crisis (consult your doctor immediately) Higher than 180 mm Hg and/or Higher than 120 mm Hg  Source: American Heart Association and American Stroke Association. For more on getting your blood pressure under control, buy Controlling Your Blood Pressure, a Special Health Report from [facility].

## 2021-04-08 NOTE — Progress Notes (Signed)
Office Visit    Patient Name: Zachary Terry Date of Encounter: 04/08/2021  PCP:  Daisy Floro, MD   [facility]  Cardiologist:  Meriam Sprague, MD  Advanced Practice Provider:  No care team member to display Electrophysiologist:  None   Chief Complaint    Zachary Terry is a 70 y.o. male with a hx of tobacco use, HTN, HLD, AAA, pulmonary nodule presents today for follow up of HTN.   Past Medical History    Past Medical History:  Diagnosis Date   High cholesterol 02/10/2021   History reviewed. No pertinent surgical history.  Allergies  No Known Allergies  History of Present Illness    Zachary Terry is a 70 y.o. male with a hx of tobacco use, HTN, HLD, AAA, pulmonary nodule  last seen 02/2020 by Dr. Delton See.  Previous ultrasound abdominal aorta 09/30/2019 with abdominal aortic aneurysm maximal dimension 3.4 cm.  Recommended for follow-up ultrasound in 3 years.  He had CT cardiac scoring 12/05/2019 with calcium score of 769 placing him in the 52nd percentile for age and sex matched control.  Dr. Delton See had previously started him on rosuvastatin.  He had CT lung cancer screening 02/2021 with findings of coronary artery disease, aortic atherosclerosis, emphysema, single small pulmonary nodule in right upper lobe measuring 3 mm.  Recommended for repeat CT in 1 year.  Presents today for follow-up.  Very pleasant gentleman.  She has with me that he has a family just got back from a week and [facility] where they visit very fall.  He is very active golfing once or twice a week and bowling twice per week.  Endorses following a low-sodium, heart healthy diet.  Continues to smoke about 2 cigars per day.  Notes blood pressure persistently in the 140s over 80s despite addition of hydrochlorothiazide. Reports no shortness of breath nor dyspnea on exertion. Reports no chest pain, pressure, or tightness. No edema, orthopnea, PND. Reports no palpitations.    EKGs/Labs/Other Studies Reviewed:   The following studies were reviewed today:  CT cardiac scoring 12/05/19 IMPRESSION: Coronary calcium score of 169. This was 28 percentile for age and sex matched control.  [place] Abd Aorta 09/2019   IMPRESSION: Abdominal aortic aneurysm, maximal dimension 3.4 cm. Recommend followup by ultrasound in 3 years. This recommendation follows ACR consensus guidelines: White Paper of the ACR Incidental Findings Committee II on Vascular Findings. J Am Coll Radiol 2013; 10:789-794. Aortic aneurysm NOS (ICD10-I71.9)  CT chest lung 02/10/21  Cardiovascular: Normal heart size. Calcifications of the LAD and RCA. Atherosclerotic disease of the thoracic aorta.   Mediastinum/Nodes: No pathologically enlarged lymph nodes seen in the chest. Small hiatal hernia. Normal thyroid. Central airways are patent. Upper lobe predominant centrilobular emphysema.   Lungs/Pleura: Central airways are patent. Upper lobe predominant centrilobular emphysema. No consolidation, pleural effusion or pneumothorax. Small solid pulmonary nodule the right upper lobe measuring 3 mm located on series 3, image 156.   Upper Abdomen: No acute abnormality.   Musculoskeletal: No chest wall mass or suspicious bone lesions identified. IMPRESSION: Lung-RADS 2, benign appearance or behavior. Continue annual screening with low-dose chest CT without contrast in 12 months.   Coronary artery calcifications of the LAD and RCA.   Aortic Atherosclerosis (ICD10-I70.0) and Emphysema (ICD10-J43.9).   EKG:  EKG is  ordered today.  The ekg ordered today demonstrates SB 55 bpm with no acute ST/T wave changes.   Recent Labs: No results found for  requested labs within last 8760 hours.  Recent Lipid Panel    Component Value Date/Time   CHOL 156 02/19/2020 0915   TRIG 92 02/19/2020 0915   HDL 55 02/19/2020 0915   CHOLHDL 2.8 02/19/2020 0915   LDLCALC 84 02/19/2020 0915    Home Medications   Current  Meds  Medication Sig   Ascorbic Acid (VITAMIN C PO) Take 1 tablet by mouth daily.   aspirin EC 81 MG tablet Take 81 mg by mouth daily. Swallow whole.   hydrochlorothiazide (HYDRODIURIL) 25 MG tablet Take 1 tablet (25 mg total) by mouth daily. Pt needs to keep upcoming appt in Sept for further refills   milk thistle 175 MG tablet Take 175 mg by mouth daily.   rosuvastatin (CRESTOR) 10 MG tablet Take 1 tablet (10 mg total) by mouth daily. Please keep upcoming appt in September 2022 before anymore refills. Thank you   sildenafil (REVATIO) 20 MG tablet Take 20 mg by mouth as needed.   valsartan (DIOVAN) 160 MG tablet Take 1 tablet (160 mg total) by mouth daily.   VITAMIN D PO Take 1 tablet by mouth daily.     Review of Systems      All other systems reviewed and are otherwise negative except as noted above.  Physical Exam    VS:  BP 140/90   Pulse (!) 55   Ht 5' 11.5" (1.816 m)   Wt 180 lb 1.6 oz (81.7 kg)   SpO2 97%   BMI 24.77 kg/m  , BMI Body mass index is 24.77 kg/m.  Wt Readings from Last 3 Encounters:  04/08/21 180 lb 1.6 oz (81.7 kg)  03/10/20 182 lb (82.6 kg)  12/05/19 181 lb (82.1 kg)     GEN: Well nourished, well developed, in no acute distress. HEENT: normal. Neck: Supple, no JVD, carotid bruits, or masses. Cardiac: RRR, no murmurs, rubs, or gallops. No clubbing, cyanosis, edema.  Radials/PT 2+ and equal bilaterally.  Respiratory:  Respirations regular and unlabored, clear to auscultation bilaterally. GI: Soft, nontender, nondistended. MS: No deformity or atrophy. Skin: Warm and dry, no rash. Neuro:  Strength and sensation are intact. Psych: Normal affect.  Assessment & Plan    HTN - BP elevated persistently 140s/80s. Minimal improvement with HCTZ. Will plan to check CMP today. If renal function normal, will stop HCTZ and increase Valsartan to 300mg  QD. If renal function abnormal, utilize Amlodipine. Home monitoring encouraged.   HLD - LDL goal <70 Continue  Rosuvastatin 10mg  QD. Lipid panel, CMP today. If not at goal, plan to increase dose.   AAA - Duplex 09/2019 maximal dimension 3.4 cm Recommended for repeat duplex in 3 years. Can be coordinated at follow up   Coronary artery disease - Stable with no anginal symptoms. No indication for ischemic evaluation.  GDMT includes aspirin, rosuvastatin. No beta blocker due to baseline bradycardia. Heart healthy diet and regular cardiovascular exercise encouraged.    Disposition: Follow up in 6 month(s) with Dr. or APP.  Signed, 10/2019, NP 04/08/2021, 8:02 AM [facility]

## 2021-04-09 LAB — COMPREHENSIVE METABOLIC PANEL
ALT: 18 IU/L (ref 0–44)
AST: 21 IU/L (ref 0–40)
Albumin/Globulin Ratio: 2.7 — ABNORMAL HIGH (ref 1.2–2.2)
Albumin: 4.6 g/dL (ref 3.7–4.7)
Alkaline Phosphatase: 52 IU/L (ref 44–121)
BUN/Creatinine Ratio: 13 (ref 10–24)
BUN: 12 mg/dL (ref 8–27)
Bilirubin Total: 0.7 mg/dL (ref 0.0–1.2)
CO2: 25 mmol/L (ref 20–29)
Calcium: 9.8 mg/dL (ref 8.6–10.2)
Chloride: 106 mmol/L (ref 96–106)
Creatinine, Ser: 0.94 mg/dL (ref 0.76–1.27)
Globulin, Total: 1.7 g/dL (ref 1.5–4.5)
Glucose: 88 mg/dL (ref 70–99)
Potassium: 4.5 mmol/L (ref 3.5–5.2)
Sodium: 145 mmol/L — ABNORMAL HIGH (ref 134–144)
Total Protein: 6.3 g/dL (ref 6.0–8.5)
eGFR: 87 mL/min/{1.73_m2} (ref >59)

## 2021-04-09 LAB — LIPID PANEL
Chol/HDL Ratio: 2.5 ratio (ref 0.0–5.0)
Cholesterol, Total: 158 mg/dL (ref 100–199)
HDL: 64 mg/dL (ref >39)
LDL Chol Calc (NIH): 82 mg/dL (ref 0–99)
Triglycerides: 62 mg/dL (ref 0–149)
VLDL Cholesterol Cal: 12 mg/dL (ref 5–40)

## 2021-04-12 ENCOUNTER — Encounter (HOSPITAL_BASED_OUTPATIENT_CLINIC_OR_DEPARTMENT_OTHER): Payer: Self-pay | Admitting: Family

## 2021-04-12 ENCOUNTER — Telehealth: Payer: Self-pay | Admitting: Family

## 2021-04-12 DIAGNOSIS — I25118 Atherosclerotic heart disease of native coronary artery with other forms of angina pectoris: Secondary | ICD-10-CM

## 2021-04-12 DIAGNOSIS — E785 Hyperlipidemia, unspecified: Secondary | ICD-10-CM

## 2021-04-12 DIAGNOSIS — I10 Essential (primary) hypertension: Secondary | ICD-10-CM

## 2021-04-12 DIAGNOSIS — F17200 Nicotine dependence, unspecified, uncomplicated: Secondary | ICD-10-CM

## 2021-04-12 DIAGNOSIS — I714 Abdominal aortic aneurysm, without rupture, unspecified: Secondary | ICD-10-CM

## 2021-04-12 MED ORDER — VALSARTAN 320 MG PO TABS
320.0000 mg | ORAL_TABLET | Freq: Every day | ORAL | 1 refills | Status: DC
Start: 2021-04-12 — End: 2021-04-18

## 2021-04-12 MED ORDER — ROSUVASTATIN CALCIUM 20 MG PO TABS: 20.0000 mg | ORAL_TABLET | Freq: Every day | ORAL | 1 refills | Status: DC

## 2021-04-12 NOTE — Telephone Encounter (Signed)
° ° °  Pt is is returning call to get lab results °

## 2021-04-12 NOTE — Telephone Encounter (Signed)
Zachary Sorrow, NP  04/11/2021 11:25 AM EDT     Normal liver and kidney function. For optimization of blood pressure recommend stop HCTZ and increase Valsartan to 300mg  daily. Repeat BMP in 2 weeks.   LDL (bad cholesterol) not at goal of less than 70. Recommend increasing Rosuvastatin to 20mg  daily with repeat FLP/LFT in 2 months.    Spoke to pt and informed him of above results. Informed him I will also mail results and lab slips to him. Pt verbalized thanks and understanding. He stated he will come to [facility] for labs.   Informed him he could double up on Rosuvastatin 10 mg until he ran out of those, but that I will go ahead and send in the new 20 mg Rx with the Valsartan 320 mg for him to pick up. Pt verbalized thanks.  --Labs ordered: BMET (week of 04/26/21); FLP/LFT (1st week of January 2023) --Rx for Rosuvastatin 20 mg and Valsartan 320 mg sent to [facility] on [address]

## 2021-04-12 NOTE — Telephone Encounter (Signed)
Spoke to pt. He stated he could not remember if we had gone over when we spoke earlier, but wanted to make sure he is supposed to stop taking the HCTZ when starting increased dose of Valsartan. Informed patient that is correct, and that he can take two of the 160 mg Valsartan tablets until he runs out. He stated he will do that and then will pick up the new prescription. Went over the same for Rosuvastatin again and pt verbalized understanding. He stated that cleared everything up and he thanked me for calling him back.

## 2021-04-12 NOTE — Telephone Encounter (Signed)
Patient called back and had some follow-up questions for Ladona Ridgel and wanted to speak to her again

## 2021-04-18 ENCOUNTER — Other Ambulatory Visit: Payer: Self-pay

## 2021-04-18 MED ORDER — VALSARTAN 320 MG PO TABS: 320.0000 mg | ORAL_TABLET | Freq: Every day | ORAL | 3 refills | Status: DC

## 2021-04-26 ENCOUNTER — Telehealth (HOSPITAL_BASED_OUTPATIENT_CLINIC_OR_DEPARTMENT_OTHER): Payer: Self-pay | Admitting: Family

## 2021-04-26 DIAGNOSIS — I10 Essential (primary) hypertension: Secondary | ICD-10-CM | POA: Diagnosis not present

## 2021-04-26 DIAGNOSIS — E785 Hyperlipidemia, unspecified: Secondary | ICD-10-CM | POA: Diagnosis not present

## 2021-04-26 DIAGNOSIS — I25118 Atherosclerotic heart disease of native coronary artery with other forms of angina pectoris: Secondary | ICD-10-CM | POA: Diagnosis not present

## 2021-04-26 NOTE — Telephone Encounter (Signed)
Patient has his bal work drawn to day and wants to be sure we mail him a copy

## 2021-04-27 LAB — BASIC METABOLIC PANEL
BUN/Creatinine Ratio: 22 (ref 10–24)
BUN: 17 mg/dL (ref 8–27)
CO2: 24 mmol/L (ref 20–29)
Calcium: 9.8 mg/dL (ref 8.6–10.2)
Chloride: 104 mmol/L (ref 96–106)
Creatinine, Ser: 0.78 mg/dL (ref 0.76–1.27)
Glucose: 85 mg/dL (ref 70–99)
Potassium: 4.9 mmol/L (ref 3.5–5.2)
Sodium: 141 mmol/L (ref 134–144)
eGFR: 95 mL/min/{1.73_m2} (ref >59)

## 2021-04-27 NOTE — Telephone Encounter (Signed)
Copy of labs placed in mail for patient per his request.  Alver Sorrow, NP

## 2021-05-05 ENCOUNTER — Other Ambulatory Visit: Payer: Self-pay | Admitting: Family

## 2021-06-13 DIAGNOSIS — B349 Viral infection, unspecified: Secondary | ICD-10-CM | POA: Diagnosis not present

## 2021-06-13 DIAGNOSIS — Z03818 Encounter for observation for suspected exposure to other biological agents ruled out: Secondary | ICD-10-CM | POA: Diagnosis not present

## 2021-06-14 DIAGNOSIS — J069 Acute upper respiratory infection, unspecified: Secondary | ICD-10-CM | POA: Diagnosis not present

## 2021-06-21 ENCOUNTER — Other Ambulatory Visit: Payer: Medicare Other

## 2021-06-21 DIAGNOSIS — E785 Hyperlipidemia, unspecified: Secondary | ICD-10-CM | POA: Diagnosis not present

## 2021-06-22 ENCOUNTER — Encounter (HOSPITAL_BASED_OUTPATIENT_CLINIC_OR_DEPARTMENT_OTHER): Payer: Self-pay

## 2021-06-22 LAB — LIPID PANEL
Chol/HDL Ratio: 2.7 ratio (ref 0.0–5.0)
Cholesterol, Total: 117 mg/dL (ref 100–199)
HDL: 43 mg/dL (ref >39)
LDL Chol Calc (NIH): 59 mg/dL (ref 0–99)
Triglycerides: 76 mg/dL (ref 0–149)
VLDL Cholesterol Cal: 15 mg/dL (ref 5–40)

## 2021-06-22 LAB — HEPATIC FUNCTION PANEL
ALT: 30 IU/L (ref 0–44)
AST: 19 IU/L (ref 0–40)
Albumin: 3.7 g/dL (ref 3.7–4.7)
Alkaline Phosphatase: 79 IU/L (ref 44–121)
Bilirubin Total: 0.7 mg/dL (ref 0.0–1.2)
Bilirubin, Direct: 0.23 mg/dL (ref 0.00–0.40)
Total Protein: 6.5 g/dL (ref 6.0–8.5)

## 2021-06-22 NOTE — Progress Notes (Signed)
Letter mailed to patient.

## 2021-09-06 DIAGNOSIS — E7800 Pure hypercholesterolemia, unspecified: Secondary | ICD-10-CM | POA: Diagnosis not present

## 2021-09-06 DIAGNOSIS — Z79899 Other long term (current) drug therapy: Secondary | ICD-10-CM | POA: Diagnosis not present

## 2021-09-13 DIAGNOSIS — Z0000 Encounter for general adult medical examination without abnormal findings: Secondary | ICD-10-CM | POA: Diagnosis not present

## 2021-09-13 DIAGNOSIS — I10 Essential (primary) hypertension: Secondary | ICD-10-CM | POA: Diagnosis not present

## 2021-09-13 DIAGNOSIS — R634 Abnormal weight loss: Secondary | ICD-10-CM | POA: Diagnosis not present

## 2021-09-13 DIAGNOSIS — F17200 Nicotine dependence, unspecified, uncomplicated: Secondary | ICD-10-CM | POA: Diagnosis not present

## 2021-09-15 ENCOUNTER — Other Ambulatory Visit: Payer: Self-pay | Admitting: Family Medicine

## 2021-09-15 ENCOUNTER — Other Ambulatory Visit: Payer: Self-pay | Admitting: Surgery

## 2021-09-15 DIAGNOSIS — R634 Abnormal weight loss: Secondary | ICD-10-CM

## 2021-09-29 ENCOUNTER — Ambulatory Visit
Admission: RE | Admit: 2021-09-29 | Discharge: 2021-09-29 | Disposition: A | Discharge status: Home / Self Care | Payer: Medicare Other | Source: Ambulatory Visit | Attending: Family Medicine | Admitting: Family Medicine

## 2021-09-29 DIAGNOSIS — I714 Abdominal aortic aneurysm, without rupture, unspecified: Secondary | ICD-10-CM | POA: Diagnosis not present

## 2021-09-29 DIAGNOSIS — R634 Abnormal weight loss: Secondary | ICD-10-CM | POA: Diagnosis not present

## 2021-09-29 DIAGNOSIS — M4697 Unspecified inflammatory spondylopathy, lumbosacral region: Secondary | ICD-10-CM | POA: Diagnosis not present

## 2021-09-29 DIAGNOSIS — M47817 Spondylosis without myelopathy or radiculopathy, lumbosacral region: Secondary | ICD-10-CM | POA: Diagnosis not present

## 2021-09-29 IMAGING — CT CT CHEST LUNG CANCER SCREENING LOW DOSE W/O CM
1 of 2 series · 10 of 20 positions shown, 13 images · non-contrast
Comparison: None.

CLINICAL DATA: Current smoker with 60 pack-year history

EXAM:
CT CHEST WITHOUT CONTRAST LOW-DOSE FOR LUNG CANCER SCREENING
TECHNIQUE: Multidetector CT imaging of the chest was performed following the
standard protocol without IV contrast.

[ct lung segmentation data · axial · 0.78mm/px · z∈[-402,-402]mm · 10 of 372 frames shown]
[frame 1/372  mediastinal]
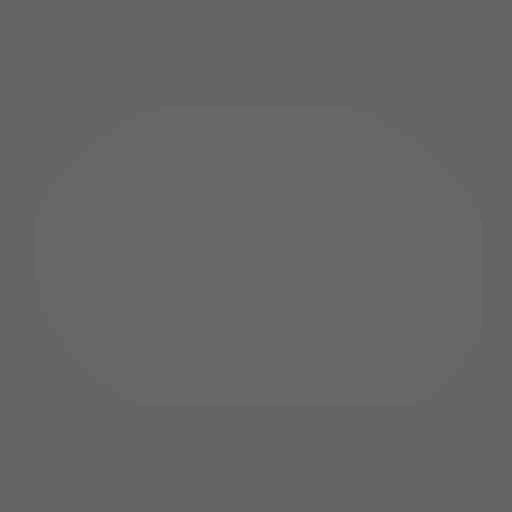
[frame 1/372  lung]
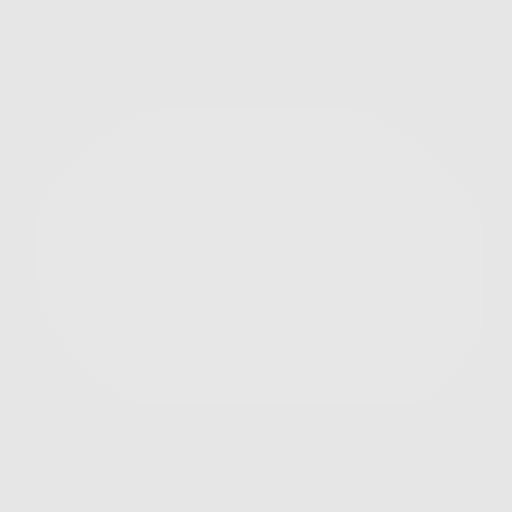
[frame 42/372  lung]
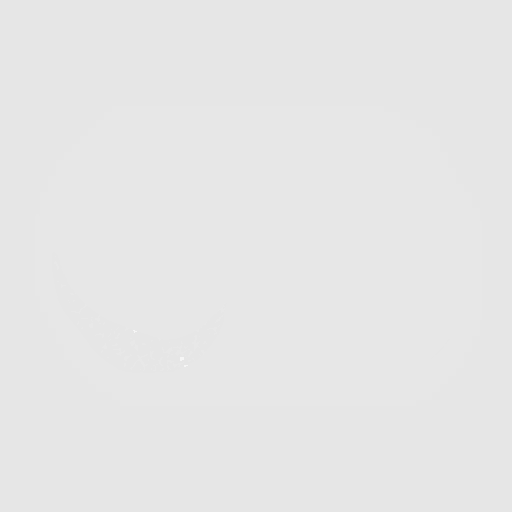
[frame 83/372  lung]
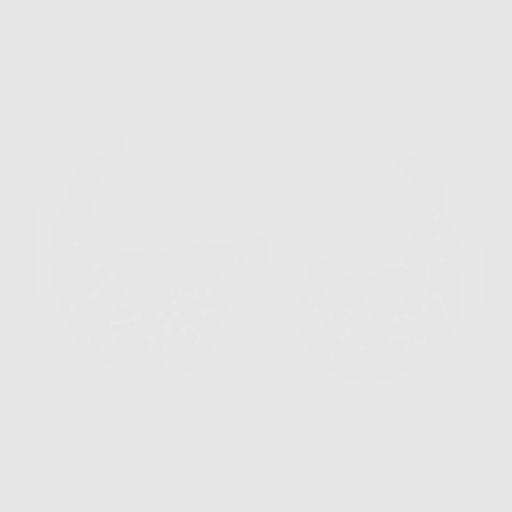
[frame 124/372  lung]
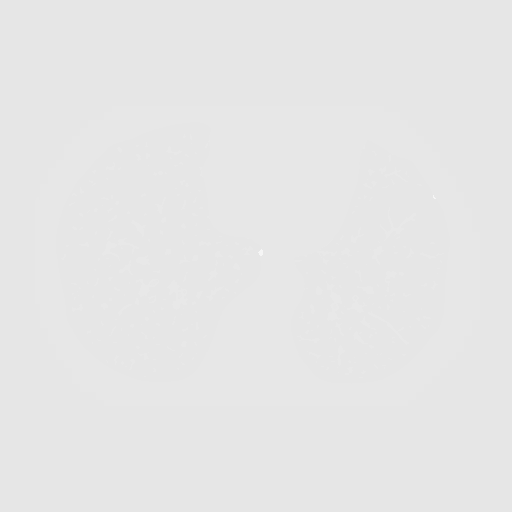
[frame 165/372  mediastinal]
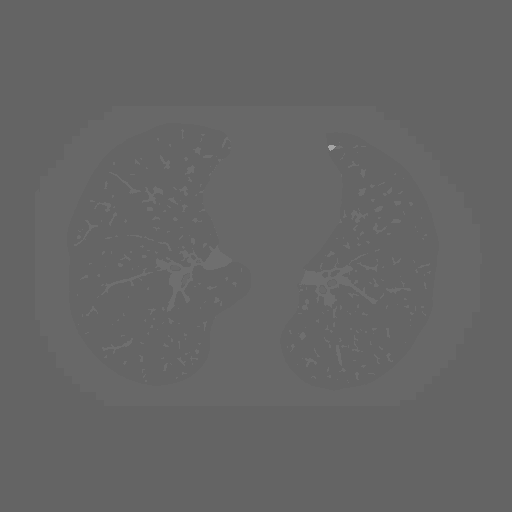
[frame 165/372  lung]
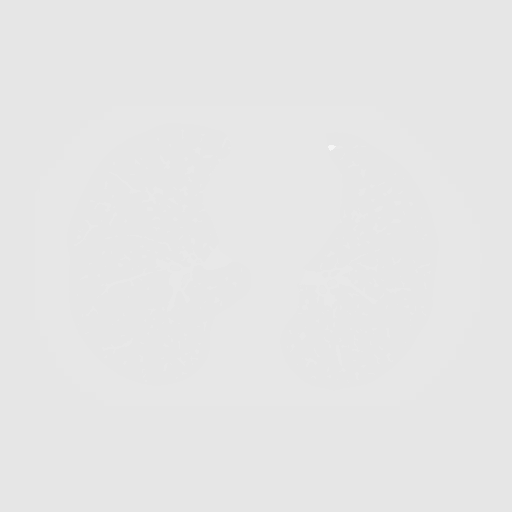
[frame 207/372  lung]
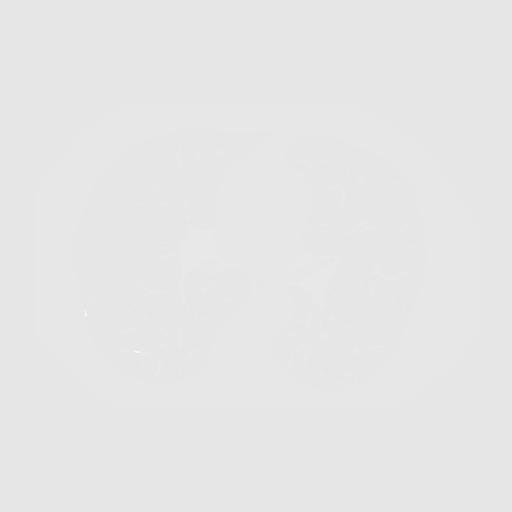
[frame 248/372  lung]
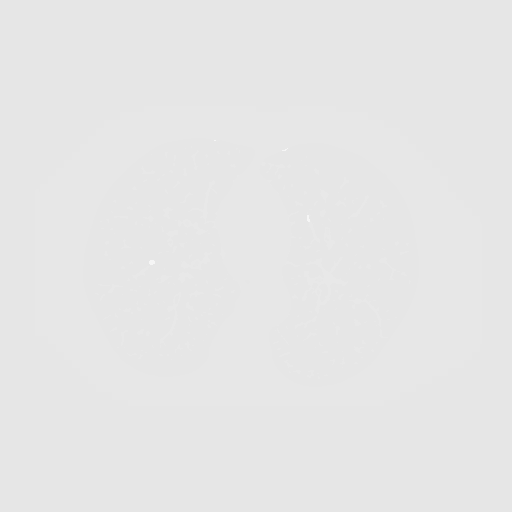
[frame 289/372  lung]
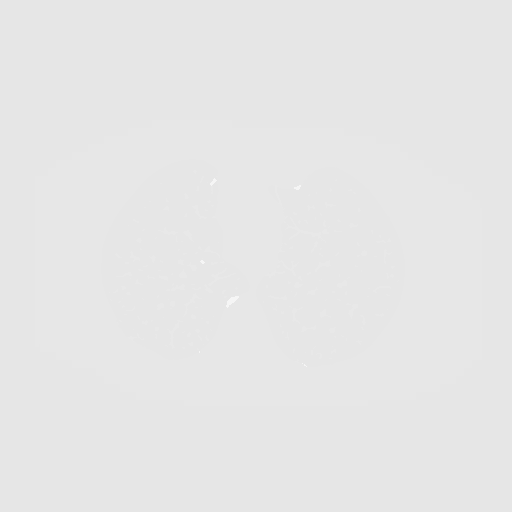
[frame 330/372  mediastinal]
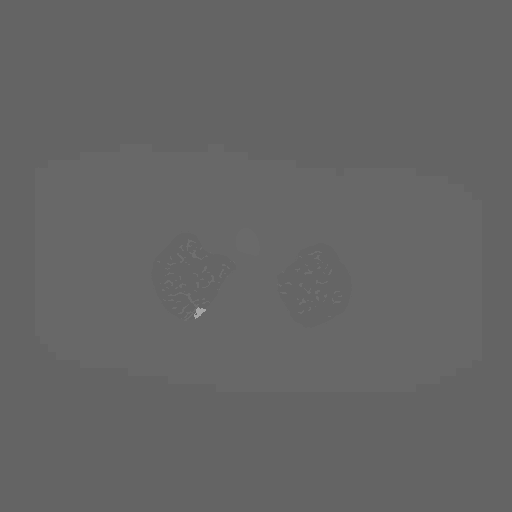
[frame 330/372  lung]
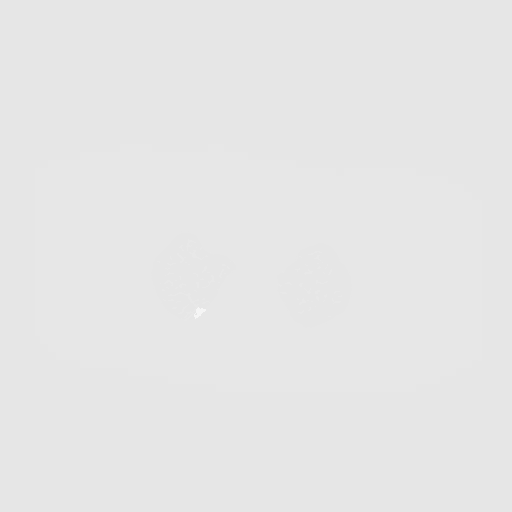
[frame 372/372  lung]
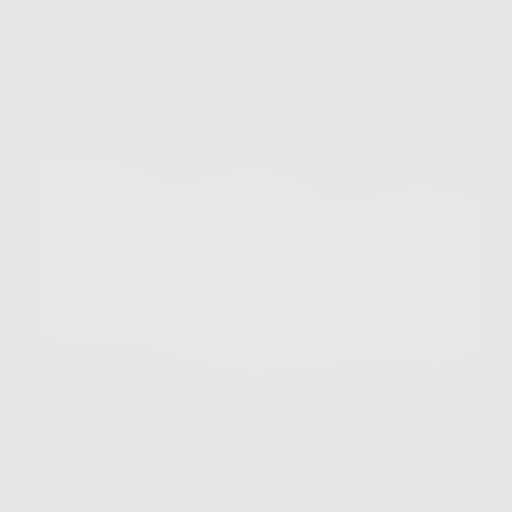

[10 of 20 positions shown; findings below may reference images not displayed]

FINDINGS: Cardiovascular: Normal heart size. Calcifications of the LAD and
RCA. Atherosclerotic disease of the thoracic aorta.

Mediastinum/Nodes: No pathologically enlarged lymph nodes seen in
the chest. Small hiatal hernia. Normal thyroid. Central airways are
patent. Upper lobe predominant centrilobular emphysema.

Lungs/Pleura: Central airways are patent. Upper lobe predominant
centrilobular emphysema. No consolidation, pleural effusion or
pneumothorax. Small solid pulmonary nodule the right upper lobe
measuring 3 mm located on series 3, image 156.

Upper Abdomen: No acute abnormality.

Musculoskeletal: No chest wall mass or suspicious bone lesions
identified.
IMPRESSION: Lung-RADS 2, benign appearance or behavior. Continue annual
screening with low-dose chest CT without contrast in 12 months.

Coronary artery calcifications of the LAD and RCA.

Aortic Atherosclerosis (JS257-YAS.S) and Emphysema (JS257-KZF.3).

## 2021-09-29 MED ORDER — IOPAMIDOL (ISOVUE-300) INJECTION 61%
100.0000 mL | Freq: Once | INTRAVENOUS | Status: AC | PRN
  Administered 2021-09-29: 100 mL via INTRAVENOUS

## 2021-10-05 ENCOUNTER — Other Ambulatory Visit: Payer: Self-pay | Admitting: Family

## 2021-10-05 DIAGNOSIS — E785 Hyperlipidemia, unspecified: Secondary | ICD-10-CM

## 2021-10-05 DIAGNOSIS — F17200 Nicotine dependence, unspecified, uncomplicated: Secondary | ICD-10-CM

## 2021-10-05 DIAGNOSIS — I714 Abdominal aortic aneurysm, without rupture, unspecified: Secondary | ICD-10-CM

## 2021-10-05 NOTE — Telephone Encounter (Signed)
Rx(s) sent to pharmacy electronically.  

## 2021-10-26 DIAGNOSIS — J439 Emphysema, unspecified: Secondary | ICD-10-CM | POA: Diagnosis not present

## 2021-10-26 DIAGNOSIS — R935 Abnormal findings on diagnostic imaging of other abdominal regions, including retroperitoneum: Secondary | ICD-10-CM | POA: Diagnosis not present

## 2021-10-26 DIAGNOSIS — I714 Abdominal aortic aneurysm, without rupture, unspecified: Secondary | ICD-10-CM | POA: Diagnosis not present

## 2021-11-11 DIAGNOSIS — M19071 Primary osteoarthritis, right ankle and foot: Secondary | ICD-10-CM | POA: Diagnosis not present

## 2022-01-03 ENCOUNTER — Encounter: Payer: Self-pay | Admitting: Cardiology

## 2022-01-09 ENCOUNTER — Other Ambulatory Visit: Payer: Self-pay | Admitting: Cardiology

## 2022-01-09 MED ORDER — AMLODIPINE BESYLATE 5 MG PO TABS: 5.0000 mg | ORAL_TABLET | Freq: Every day | ORAL | 3 refills | Status: AC

## 2022-02-10 ENCOUNTER — Ambulatory Visit
Admission: RE | Admit: 2022-02-10 | Discharge: 2022-02-10 | Disposition: A | Discharge status: Home / Self Care | Payer: Medicare Other | Source: Ambulatory Visit

## 2022-02-10 DIAGNOSIS — F17210 Nicotine dependence, cigarettes, uncomplicated: Secondary | ICD-10-CM

## 2022-02-10 DIAGNOSIS — Z87891 Personal history of nicotine dependence: Secondary | ICD-10-CM

## 2022-02-14 ENCOUNTER — Other Ambulatory Visit: Payer: Self-pay | Admitting: Acute Care

## 2022-02-14 DIAGNOSIS — F17210 Nicotine dependence, cigarettes, uncomplicated: Secondary | ICD-10-CM

## 2022-02-14 DIAGNOSIS — Z87891 Personal history of nicotine dependence: Secondary | ICD-10-CM

## 2022-02-14 DIAGNOSIS — Z122 Encounter for screening for malignant neoplasm of respiratory organs: Secondary | ICD-10-CM

## 2022-02-16 DIAGNOSIS — H53143 Visual discomfort, bilateral: Secondary | ICD-10-CM | POA: Diagnosis not present

## 2022-02-16 DIAGNOSIS — Z9849 Cataract extraction status, unspecified eye: Secondary | ICD-10-CM | POA: Diagnosis not present

## 2022-02-16 DIAGNOSIS — H43813 Vitreous degeneration, bilateral: Secondary | ICD-10-CM | POA: Diagnosis not present

## 2022-02-16 DIAGNOSIS — Z961 Presence of intraocular lens: Secondary | ICD-10-CM | POA: Diagnosis not present

## 2022-02-16 DIAGNOSIS — H43393 Other vitreous opacities, bilateral: Secondary | ICD-10-CM | POA: Diagnosis not present

## 2022-03-09 NOTE — Progress Notes (Deleted)
Cardiology Office Note:    Date:  03/09/2022   ID:  Zachary Terry, DOB 03-28-1951, MRN [id number]  PCP:  Lawerance Cruel, [place] Providers Cardiologist:  Freada Bergeron, MD { Click to update primary MD,subspecialty MD or APP then REFRESH:1}    Referring MD: Lawerance Cruel, MD   No chief complaint on file. ***  History of Present Illness:    Zachary Terry is a 71 y.o. male with a hx of ***  Past Medical History:  Diagnosis Date   High cholesterol 02/10/2021    No past surgical history on file.  Current Medications: No outpatient medications have been marked as taking for the 03/15/22 encounter (Appointment) with Freada Bergeron, MD.     Allergies:   Patient has no known allergies.   Social History   Socioeconomic History   Marital status: Married    Spouse name: Not on file   Number of children: Not on file   Years of education: Not on file   Highest education level: Not on file  Occupational History   Not on file  Tobacco Use   Smoking status: Every Day    Types: Cigars   Smokeless tobacco: Never  Substance and Sexual Activity   Alcohol use: Not on file   Drug use: Not on file   Sexual activity: Not on file  Other Topics Concern   Not on file  Social History Narrative   Not on file   Social Determinants of Health   Financial Resource Strain: Not on file  Food Insecurity: Not on file  Transportation Needs: Not on file  Physical Activity: Not on file  Stress: Not on file  Social Connections: Not on file     Family History: The patient's ***family history is not on file.  ROS:   Please see the history of present illness.    *** All other systems reviewed and are negative.  EKGs/Labs/Other Studies Reviewed:    The following studies were reviewed today: ***  EKG:  EKG is *** ordered today.  The ekg ordered today demonstrates ***  Recent Labs: 04/26/2021: BUN 17; Creatinine, Ser 0.78; Potassium 4.9;  Sodium 141 06/21/2021: ALT 30  Recent Lipid Panel    Component Value Date/Time   CHOL 117 06/21/2021 1254   TRIG 76 06/21/2021 1254   HDL 43 06/21/2021 1254   CHOLHDL 2.7 06/21/2021 1254   LDLCALC 59 06/21/2021 1254     Risk Assessment/Calculations:   {Does this patient have ATRIAL FIBRILLATION?:[phone]}  No BP recorded.  {Refresh Note OR Click here to enter BP  :1}***         Physical Exam:    VS:  There were no vitals taken for this visit.    Wt Readings from Last 3 Encounters:  04/08/21 180 lb 1.6 oz (81.7 kg)  03/10/20 182 lb (82.6 kg)  12/05/19 181 lb (82.1 kg)     GEN: *** Well nourished, well developed in no acute distress HEENT: Normal NECK: No JVD; No carotid bruits LYMPHATICS: No lymphadenopathy CARDIAC: ***RRR, no murmurs, rubs, gallops RESPIRATORY:  Clear to auscultation without rales, wheezing or rhonchi  ABDOMEN: Soft, non-tender, non-distended MUSCULOSKELETAL:  No edema; No deformity  SKIN: Warm and dry NEUROLOGIC:  Alert and oriented x 3 PSYCHIATRIC:  Normal affect   ASSESSMENT:    No diagnosis found. PLAN:    In order of problems listed above:  ***      {Are you  ordering a CV Procedure (e.g. stress test, cath, DCCV, TEE, etc)?   Press F2        :[id number]}    Medication Adjustments/Labs and Tests Ordered: Current medicines are reviewed at length with the patient today.  Concerns regarding medicines are outlined above.  No orders of the defined types were placed in this encounter.  No orders of the defined types were placed in this encounter.   There are no Patient Instructions on file for this visit.   Signed, Meriam Sprague, MD  03/09/2022 2:00 PM    [facility]

## 2022-03-10 DIAGNOSIS — U071 COVID-19: Secondary | ICD-10-CM | POA: Diagnosis not present

## 2022-03-15 ENCOUNTER — Ambulatory Visit: Payer: Medicare Other | Admitting: Cardiology

## 2022-03-28 ENCOUNTER — Other Ambulatory Visit: Payer: Self-pay | Admitting: Family

## 2022-03-28 DIAGNOSIS — E785 Hyperlipidemia, unspecified: Secondary | ICD-10-CM

## 2022-03-28 DIAGNOSIS — F17200 Nicotine dependence, unspecified, uncomplicated: Secondary | ICD-10-CM

## 2022-03-28 DIAGNOSIS — I714 Abdominal aortic aneurysm, without rupture, unspecified: Secondary | ICD-10-CM

## 2022-03-28 NOTE — Telephone Encounter (Signed)
Pt of Dr. Johney Frame. Has appt coming up on 04/11/22. Please review for refill. Thank you!

## 2022-04-09 NOTE — Progress Notes (Unsigned)
Cardiology Office Note:    Date:  04/09/2022   ID:  Zachary Terry, DOB [birth year], MRN [id number]  PCP:  Lawerance Cruel, [place] Providers Cardiologist:  Freada Bergeron, MD {  Referring MD: Lawerance Cruel, MD    History of Present Illness:    Zachary Terry is a 71 y.o. male with a hx of tobacco use, HTN, HLD, AAA, and pulmonary nodule who presents to clinic for follow-up.  Per review of the record, previous ultrasound abdominal aorta 09/30/2019 with abdominal aortic aneurysm maximal dimension 3.4 cm.  Recommended for follow-up ultrasound in 3 years.  He had CT cardiac scoring 12/05/2019 with calcium score of 769 placing him in the 52nd percentile for age and sex matched control.  Dr. Meda Coffee had previously started him on rosuvastatin.  He had CT lung cancer screening 02/2021 with findings of coronary artery disease, aortic atherosclerosis, emphysema, single small pulmonary nodule in right upper lobe measuring 3 mm.  Recommended for repeat CT in 1 year.  Was last seen in clinic on 04/08/21 by Laurann Montana, NP where he was doing well.   Today, ***    Past Medical History:  Diagnosis Date   High cholesterol 02/10/2021    No past surgical history on file.  Current Medications: No outpatient medications have been marked as taking for the 04/11/22 encounter (Appointment) with Freada Bergeron, MD.     Allergies:   Patient has no known allergies.   Social History   Socioeconomic History   Marital status: Married    Spouse name: Not on file   Number of children: Not on file   Years of education: Not on file   Highest education level: Not on file  Occupational History   Not on file  Tobacco Use   Smoking status: Every Day    Types: Cigars   Smokeless tobacco: Never  Substance and Sexual Activity   Alcohol use: Not on file   Drug use: Not on file   Sexual activity: Not on file  Other Topics Concern   Not on file  Social History  Narrative   Not on file   Social Determinants of Health   Financial Resource Strain: Not on file  Food Insecurity: Not on file  Transportation Needs: Not on file  Physical Activity: Not on file  Stress: Not on file  Social Connections: Not on file     Family History: The patient's ***family history is not on file.  ROS:   Please see the history of present illness.    *** All other systems reviewed and are negative.  EKGs/Labs/Other Studies Reviewed:    The following studies were reviewed today: CT cardiac scoring 12/05/19 IMPRESSION: Coronary calcium score of 169. This was 43 percentile for age and sex matched control.   [place] Abd Aorta 09/2019   IMPRESSION: Abdominal aortic aneurysm, maximal dimension 3.4 cm. Recommend followup by ultrasound in 3 years. This recommendation follows ACR consensus guidelines: White Paper of the ACR Incidental Findings Committee II on Vascular Findings. J Am Coll Radiol 2013; 10:789-794. Aortic aneurysm NOS (ICD10-I71.9)   CT chest lung 02/10/21  Cardiovascular: Normal heart size. Calcifications of the LAD and RCA. Atherosclerotic disease of the thoracic aorta.   Mediastinum/Nodes: No pathologically enlarged lymph nodes seen in the chest. Small hiatal hernia. Normal thyroid. Central airways are patent. Upper lobe predominant centrilobular emphysema.   Lungs/Pleura: Central airways are patent. Upper lobe predominant centrilobular emphysema. No consolidation, pleural  effusion or pneumothorax. Small solid pulmonary nodule the right upper lobe measuring 3 mm located on series 3, image 156.   Upper Abdomen: No acute abnormality.   Musculoskeletal: No chest wall mass or suspicious bone lesions identified. IMPRESSION: Lung-RADS 2, benign appearance or behavior. Continue annual screening with low-dose chest CT without contrast in 12 months.   Coronary artery calcifications of the LAD and RCA.   Aortic Atherosclerosis (ICD10-I70.0) and  Emphysema (ICD10-J43.9).  EKG:  EKG is *** ordered today.  The ekg ordered today demonstrates ***  Recent Labs: 04/26/2021: BUN 17; Creatinine, Ser 0.78; Potassium 4.9; Sodium 141 06/21/2021: ALT 30  Recent Lipid Panel    Component Value Date/Time   CHOL 117 06/21/2021 1254   TRIG 76 06/21/2021 1254   HDL 43 06/21/2021 1254   CHOLHDL 2.7 06/21/2021 1254   LDLCALC 59 06/21/2021 1254     Risk Assessment/Calculations:   {Does this patient have ATRIAL FIBRILLATION?:[phone]}  No BP recorded.  {Refresh Note OR Click here to enter BP  :1}***         Physical Exam:    VS:  There were no vitals taken for this visit.    Wt Readings from Last 3 Encounters:  04/08/21 180 lb 1.6 oz (81.7 kg)  03/10/20 182 lb (82.6 kg)  12/05/19 181 lb (82.1 kg)     GEN: *** Well nourished, well developed in no acute distress HEENT: Normal NECK: No JVD; No carotid bruits LYMPHATICS: No lymphadenopathy CARDIAC: ***RRR, no murmurs, rubs, gallops RESPIRATORY:  Clear to auscultation without rales, wheezing or rhonchi  ABDOMEN: Soft, non-tender, non-distended MUSCULOSKELETAL:  No edema; No deformity  SKIN: Warm and dry NEUROLOGIC:  Alert and oriented x 3 PSYCHIATRIC:  Normal affect   ASSESSMENT:    No diagnosis found. PLAN:    In order of problems listed above:  #CAD: Stable with no anginal symptoms.  -Continue ASA 81mg  daily -Continue crestor 20mg  daily -Not on BB due to baseline bradycardia  #HTN: -Continue amlodipine 5mg  daily -Continue valsartan 320mg  daily -Continue HCTZ 25mg  daily  #HLD: -Continue crestor 20mg  daily  #AAA: Duplex 09/2019 maximal dimension 3.4 cm>3.6cm on abdominal aorta. -Continue crestor 20mg  daily -Repeat abdominal ultrasound in 3 years      {Are you ordering a CV Procedure (e.g. stress test, cath, DCCV, TEE, etc)?   Press F2        :[id number]    Medication Adjustments/Labs and Tests Ordered: Current medicines are reviewed at length with the  patient today.  Concerns regarding medicines are outlined above.  No orders of the defined types were placed in this encounter.  No orders of the defined types were placed in this encounter.   There are no Patient Instructions on file for this visit.   Signed, Freada Bergeron, MD  04/09/2022 8:32 PM    [place]

## 2022-04-11 ENCOUNTER — Encounter: Payer: Self-pay | Admitting: Cardiology

## 2022-04-11 ENCOUNTER — Other Ambulatory Visit: Payer: Self-pay | Admitting: Family Medicine

## 2022-04-11 ENCOUNTER — Ambulatory Visit: Payer: Medicare Other | Attending: Cardiology | Admitting: Cardiology

## 2022-04-11 VITALS — BP 120/80 | HR 59 | Ht 71.5 in | Wt 175.6 lb

## 2022-04-11 DIAGNOSIS — I2510 Atherosclerotic heart disease of native coronary artery without angina pectoris: Secondary | ICD-10-CM | POA: Diagnosis not present

## 2022-04-11 DIAGNOSIS — F17200 Nicotine dependence, unspecified, uncomplicated: Secondary | ICD-10-CM

## 2022-04-11 DIAGNOSIS — E785 Hyperlipidemia, unspecified: Secondary | ICD-10-CM | POA: Diagnosis not present

## 2022-04-11 DIAGNOSIS — I7143 Infrarenal abdominal aortic aneurysm, without rupture: Secondary | ICD-10-CM

## 2022-04-11 DIAGNOSIS — R935 Abnormal findings on diagnostic imaging of other abdominal regions, including retroperitoneum: Secondary | ICD-10-CM

## 2022-04-11 DIAGNOSIS — I10 Essential (primary) hypertension: Secondary | ICD-10-CM

## 2022-04-11 NOTE — Progress Notes (Signed)
Cardiology Office Note:    Date:  04/11/2022   ID:  Zachary Terry, DOB [birth year], MRN [id number]  PCP:  Lawerance Cruel, [place] Providers Cardiologist:  Freada Bergeron, MD {  Referring MD: Lawerance Cruel, MD    History of Present Illness:    Zachary Terry is a 71 y.o. male with a hx of tobacco use, HTN, HLD, AAA, and pulmonary nodule who presents to clinic for follow-up.  Per review of the record, previous ultrasound abdominal aorta 09/30/2019 with abdominal aortic aneurysm maximal dimension 3.4 cm.  Recommended for follow-up ultrasound in 3 years.  He had CT cardiac scoring 12/05/2019 with calcium score of 769 placing him in the 52nd percentile for age and sex matched control.  Dr. Meda Coffee had previously started him on rosuvastatin.  He had CT lung cancer screening 02/2021 with findings of coronary artery disease, aortic atherosclerosis, emphysema, single small pulmonary nodule in right upper lobe measuring 3 mm.  Recommended for repeat CT in 1 year.  Was last seen in clinic on 04/08/21 by Laurann Montana, NP where he was doing well.   Today, the patient presents with his wife to help provide history. He states that he has been feeling good but hasn't had a good past month. September 29th he contracted COVID and he felt okay and had a slight cold but his wife had a fever and diverticulosis and spent 6 nights in the hospital. She has since recovered but it has been stressful for both of them.  From a cardiovascular perspective he has been doing well. No chest pain, SOB, orthopnea or PND. His blood pressure is well controlled. Tolerating medications without issues.  He smokes 3 cigars a day and plans on quitting  He has been compliant with his amlodipine, rosuvastatin and valsartan.  He denies any palpitations, chest pain, shortness of breath, or peripheral edema. No lightheadedness, headaches, syncope, orthopnea, or PND.  Past Medical History:   Diagnosis Date   High cholesterol 02/10/2021    No past surgical history on file.  Current Medications: Current Meds  Medication Sig   amLODipine (NORVASC) 5 MG tablet Take 1 tablet (5 mg total) by mouth daily.   Ascorbic Acid (VITAMIN C PO) Take 1 tablet by mouth daily.   aspirin EC 81 MG tablet Take 81 mg by mouth daily. Swallow whole.   milk thistle 175 MG tablet Take 350 mg by mouth daily.   rosuvastatin (CRESTOR) 20 MG tablet TAKE 1 TABLET BY MOUTH EVERY DAY   sildenafil (REVATIO) 20 MG tablet Take 20 mg by mouth as needed.   valsartan (DIOVAN) 320 MG tablet Take 1 tablet (320 mg total) by mouth daily.   VITAMIN D PO Take 1 tablet by mouth daily.     Allergies:   Patient has no known allergies.   Social History   Socioeconomic History   Marital status: Married    Spouse name: Not on file   Number of children: Not on file   Years of education: Not on file   Highest education level: Not on file  Occupational History   Not on file  Tobacco Use   Smoking status: Every Day    Types: Cigars   Smokeless tobacco: Never  Substance and Sexual Activity   Alcohol use: Not on file   Drug use: Not on file   Sexual activity: Not on file  Other Topics Concern   Not on file  Social History Narrative  Not on file   Social Determinants of Health   Financial Resource Strain: Not on file  Food Insecurity: Not on file  Transportation Needs: Not on file  Physical Activity: Not on file  Stress: Not on file  Social Connections: Not on file     Family History: The patient's family history is not on file.  ROS:   Please see the history of present illness.    Review of Systems  Constitutional:  Negative for chills and fever.  HENT:  Negative for nosebleeds and tinnitus.   Eyes:  Negative for blurred vision and pain.  Respiratory:  Negative for cough, hemoptysis, shortness of breath and stridor.   Cardiovascular:  Negative for chest pain, palpitations, orthopnea,  claudication, leg swelling and PND.  Gastrointestinal:  Negative for blood in stool, diarrhea, nausea and vomiting.  Genitourinary:  Negative for dysuria and hematuria.  Musculoskeletal:  Negative for falls.  Neurological:  Negative for dizziness, loss of consciousness and headaches.  Psychiatric/Behavioral:  Negative for depression, hallucinations and substance abuse. The patient does not have insomnia.    EKGs/Labs/Other Studies Reviewed:    The following studies were reviewed today: CT cardiac scoring 12/05/19 IMPRESSION: Coronary calcium score of 169. This was 49 percentile for age and sex matched control.   [place] Abd Aorta 09/2019   IMPRESSION: Abdominal aortic aneurysm, maximal dimension 3.4 cm. Recommend followup by ultrasound in 3 years. This recommendation follows ACR consensus guidelines: White Paper of the ACR Incidental Findings Committee II on Vascular Findings. J Am Coll Radiol 2013; 10:789-794. Aortic aneurysm NOS (ICD10-I71.9)   CT chest lung 02/10/21  Cardiovascular: Normal heart size. Calcifications of the LAD and RCA. Atherosclerotic disease of the thoracic aorta.   Mediastinum/Nodes: No pathologically enlarged lymph nodes seen in the chest. Small hiatal hernia. Normal thyroid. Central airways are patent. Upper lobe predominant centrilobular emphysema.   Lungs/Pleura: Central airways are patent. Upper lobe predominant centrilobular emphysema. No consolidation, pleural effusion or pneumothorax. Small solid pulmonary nodule the right upper lobe measuring 3 mm located on series 3, image 156.   Upper Abdomen: No acute abnormality.   Musculoskeletal: No chest wall mass or suspicious bone lesions identified. IMPRESSION: Lung-RADS 2, benign appearance or behavior. Continue annual screening with low-dose chest CT without contrast in 12 months.   Coronary artery calcifications of the LAD and RCA.   Aortic Atherosclerosis (ICD10-I70.0) and Emphysema  (ICD10-J43.9).  EKG:  EKG is personally reviewed 04/11/22: Sinus Bradycardia HR 59  Recent Labs: 04/26/2021: BUN 17; Creatinine, Ser 0.78; Potassium 4.9; Sodium 141 06/21/2021: ALT 30  Recent Lipid Panel    Component Value Date/Time   CHOL 117 06/21/2021 1254   TRIG 76 06/21/2021 1254   HDL 43 06/21/2021 1254   CHOLHDL 2.7 06/21/2021 1254   LDLCALC 59 06/21/2021 1254     Risk Assessment/Calculations:                Physical Exam:    VS:  BP 120/80   Pulse (!) 59   Ht 5' 11.5" (1.816 m)   Wt 175 lb 9.6 oz (79.7 kg)   SpO2 98%   BMI 24.15 kg/m     Wt Readings from Last 3 Encounters:  04/11/22 175 lb 9.6 oz (79.7 kg)  04/08/21 180 lb 1.6 oz (81.7 kg)  03/10/20 182 lb (82.6 kg)     GEN:  Well nourished, well developed in no acute distress HEENT: Normal NECK: No JVD; No carotid bruits CARDIAC: RRR, no murmurs, rubs, gallops RESPIRATORY:  Clear to auscultation without rales, wheezing or rhonchi  ABDOMEN: Soft, non-tender, non-distended MUSCULOSKELETAL:  No edema; No deformity  SKIN: Warm and dry NEUROLOGIC:  Alert and oriented x 3 PSYCHIATRIC:  Normal affect   ASSESSMENT:    1. Coronary artery disease involving native coronary artery of native heart without angina pectoris   2. Essential hypertension   3. Infrarenal abdominal aortic aneurysm (AAA) without rupture ([place])   4. Hyperlipidemia LDL goal <70   5. Smoker    PLAN:    In order of problems listed above:  #CAD: Stable with no anginal symptoms.  -Continue ASA 81mg  daily -Continue crestor 20mg  daily -Not on BB due to baseline bradycardia  #HTN: Well controlled and at goal. -Continue amlodipine 5mg  daily -Continue valsartan 320mg  daily -Continue HCTZ 25mg  daily  #HLD: -Continue crestor 20mg  daily -LDL controlled 59  #AAA: Duplex 09/2019 maximal dimension 3.4 cm and 3.6 on CT abdomen in 09/2021 -Continue crestor 20mg  daily -Repeat abdominal ultrasound in 2 years        Follow Up: 1 year    Medication Adjustments/Labs and Tests Ordered: Current medicines are reviewed at length with the patient today.  Concerns regarding medicines are outlined above.  Orders Placed This Encounter  Procedures   EKG 12-Lead   No orders of the defined types were placed in this encounter.  Patient Instructions  Medication Instructions:   Your physician recommends that you continue on your current medications as directed. Please refer to the Current Medication list given to you today.  *If you need a refill on your cardiac medications before your next appointment, please call your pharmacy*   Follow-Up: At [facility], you and your health needs are our priority.  As part of our continuing mission to provide you with exceptional heart care, we have created designated Provider Care Teams.  These Care Teams include your primary Cardiologist (physician) and Advanced Practice Providers (APPs -  Physician Assistants and Nurse Practitioners) who all work together to provide you with the care you need, when you need it.  We recommend signing up for the patient portal called "MyChart".  Sign up information is provided on this After Visit Summary.  MyChart is used to connect with patients for Virtual Visits (Telemedicine).  Patients are able to view lab/test results, encounter notes, upcoming appointments, etc.  Non-urgent messages can be sent to your provider as well.   To learn more about what you can do with MyChart, go to [email or web address].    Your next appointment:   1 year(s)  The format for your next appointment:   In Person  Provider:   Freada Bergeron, MD      Information About Sugar         I,Coren O'Brien,acting as a scribe for Freada Bergeron, MD.,have documented all relevant documentation on the behalf of Freada Bergeron, MD,as directed by  Freada Bergeron, MD while in the presence of Freada Bergeron, MD.  I, Freada Bergeron, MD, have  reviewed all documentation for this visit. The documentation on 04/11/22 for the exam, diagnosis, procedures, and orders are all accurate and complete.   Signed, Freada Bergeron, MD  04/11/2022 7:49 PM    [place]

## 2022-04-11 NOTE — Patient Instructions (Signed)
Medication Instructions:   Your physician recommends that you continue on your current medications as directed. Please refer to the Current Medication list given to you today.  *If you need a refill on your cardiac medications before your next appointment, please call your pharmacy*   Follow-Up: At [facility], you and your health needs are our priority.  As part of our continuing mission to provide you with exceptional heart care, we have created designated Provider Care Teams.  These Care Teams include your primary Cardiologist (physician) and Advanced Practice Providers (APPs -  Physician Assistants and Nurse Practitioners) who all work together to provide you with the care you need, when you need it.  We recommend signing up for the patient portal called "MyChart".  Sign up information is provided on this After Visit Summary.  MyChart is used to connect with patients for Virtual Visits (Telemedicine).  Patients are able to view lab/test results, encounter notes, upcoming appointments, etc.  Non-urgent messages can be sent to your provider as well.   To learn more about what you can do with MyChart, go to [email or web address].    Your next appointment:   1 year(s)  The format for your next appointment:   In Person  Provider:   Freada Bergeron, MD      Information About Sugar

## 2022-05-07 ENCOUNTER — Ambulatory Visit
Admission: RE | Admit: 2022-05-07 | Discharge: 2022-05-07 | Disposition: A | Discharge status: Home / Self Care | Payer: Medicare Other | Source: Ambulatory Visit | Attending: Family Medicine | Admitting: Family Medicine

## 2022-05-07 DIAGNOSIS — R935 Abnormal findings on diagnostic imaging of other abdominal regions, including retroperitoneum: Secondary | ICD-10-CM

## 2022-05-07 DIAGNOSIS — D3502 Benign neoplasm of left adrenal gland: Secondary | ICD-10-CM | POA: Diagnosis not present

## 2022-05-07 DIAGNOSIS — K7689 Other specified diseases of liver: Secondary | ICD-10-CM | POA: Diagnosis not present

## 2022-05-07 MED ORDER — GADOPICLENOL 0.5 MMOL/ML IV SOLN
8.0000 mL | Freq: Once | INTRAVENOUS | Status: AC | PRN
  Administered 2022-05-07: 8 mL via INTRAVENOUS

## 2022-05-23 DIAGNOSIS — I714 Abdominal aortic aneurysm, without rupture, unspecified: Secondary | ICD-10-CM | POA: Diagnosis not present

## 2022-06-23 ENCOUNTER — Other Ambulatory Visit: Payer: Self-pay

## 2022-06-23 MED ORDER — VALSARTAN 320 MG PO TABS: 320.0000 mg | ORAL_TABLET | Freq: Every day | ORAL | 2 refills | Status: DC

## 2022-06-26 ENCOUNTER — Other Ambulatory Visit: Payer: Self-pay

## 2022-06-26 DIAGNOSIS — E785 Hyperlipidemia, unspecified: Secondary | ICD-10-CM

## 2022-06-26 DIAGNOSIS — F17200 Nicotine dependence, unspecified, uncomplicated: Secondary | ICD-10-CM

## 2022-06-26 DIAGNOSIS — I714 Abdominal aortic aneurysm, without rupture, unspecified: Secondary | ICD-10-CM

## 2022-06-26 MED ORDER — ROSUVASTATIN CALCIUM 20 MG PO TABS: 20.0000 mg | ORAL_TABLET | Freq: Every day | ORAL | 2 refills | Status: DC

## 2022-07-31 DIAGNOSIS — M19071 Primary osteoarthritis, right ankle and foot: Secondary | ICD-10-CM | POA: Diagnosis not present

## 2022-09-14 DIAGNOSIS — I10 Essential (primary) hypertension: Secondary | ICD-10-CM | POA: Diagnosis not present

## 2022-09-14 DIAGNOSIS — E7800 Pure hypercholesterolemia, unspecified: Secondary | ICD-10-CM | POA: Diagnosis not present

## 2022-09-21 DIAGNOSIS — J439 Emphysema, unspecified: Secondary | ICD-10-CM | POA: Diagnosis not present

## 2022-09-21 DIAGNOSIS — I714 Abdominal aortic aneurysm, without rupture, unspecified: Secondary | ICD-10-CM | POA: Diagnosis not present

## 2022-09-21 DIAGNOSIS — E7800 Pure hypercholesterolemia, unspecified: Secondary | ICD-10-CM | POA: Diagnosis not present

## 2022-09-21 DIAGNOSIS — I10 Essential (primary) hypertension: Secondary | ICD-10-CM | POA: Diagnosis not present

## 2022-09-21 DIAGNOSIS — R0683 Snoring: Secondary | ICD-10-CM | POA: Diagnosis not present

## 2022-09-21 DIAGNOSIS — Z0000 Encounter for general adult medical examination without abnormal findings: Secondary | ICD-10-CM | POA: Diagnosis not present

## 2022-10-17 DIAGNOSIS — I10 Essential (primary) hypertension: Secondary | ICD-10-CM | POA: Diagnosis not present

## 2022-11-13 DIAGNOSIS — I10 Essential (primary) hypertension: Secondary | ICD-10-CM | POA: Diagnosis not present

## 2022-11-13 DIAGNOSIS — G4733 Obstructive sleep apnea (adult) (pediatric): Secondary | ICD-10-CM | POA: Diagnosis not present

## 2022-11-22 DIAGNOSIS — M1909 Primary osteoarthritis, other specified site: Secondary | ICD-10-CM | POA: Diagnosis not present

## 2022-11-22 DIAGNOSIS — M19071 Primary osteoarthritis, right ankle and foot: Secondary | ICD-10-CM | POA: Diagnosis not present

## 2022-11-23 DIAGNOSIS — G4733 Obstructive sleep apnea (adult) (pediatric): Secondary | ICD-10-CM | POA: Diagnosis not present

## 2022-12-23 DIAGNOSIS — G4733 Obstructive sleep apnea (adult) (pediatric): Secondary | ICD-10-CM | POA: Diagnosis not present

## 2023-01-23 DIAGNOSIS — G4733 Obstructive sleep apnea (adult) (pediatric): Secondary | ICD-10-CM | POA: Diagnosis not present

## 2023-02-13 DIAGNOSIS — G4733 Obstructive sleep apnea (adult) (pediatric): Secondary | ICD-10-CM | POA: Diagnosis not present

## 2023-02-13 DIAGNOSIS — I10 Essential (primary) hypertension: Secondary | ICD-10-CM | POA: Diagnosis not present

## 2023-02-14 ENCOUNTER — Ambulatory Visit
Admission: RE | Admit: 2023-02-14 | Discharge: 2023-02-14 | Disposition: A | Discharge status: Home / Self Care | Payer: Medicare Other | Source: Ambulatory Visit | Attending: Family Medicine | Admitting: Family Medicine

## 2023-02-14 DIAGNOSIS — F17210 Nicotine dependence, cigarettes, uncomplicated: Secondary | ICD-10-CM

## 2023-02-14 DIAGNOSIS — Z122 Encounter for screening for malignant neoplasm of respiratory organs: Secondary | ICD-10-CM

## 2023-02-14 DIAGNOSIS — Z87891 Personal history of nicotine dependence: Secondary | ICD-10-CM

## 2023-02-21 DIAGNOSIS — H43393 Other vitreous opacities, bilateral: Secondary | ICD-10-CM | POA: Diagnosis not present

## 2023-02-21 DIAGNOSIS — H52223 Regular astigmatism, bilateral: Secondary | ICD-10-CM | POA: Diagnosis not present

## 2023-02-21 DIAGNOSIS — Z9849 Cataract extraction status, unspecified eye: Secondary | ICD-10-CM | POA: Diagnosis not present

## 2023-02-21 DIAGNOSIS — I10 Essential (primary) hypertension: Secondary | ICD-10-CM | POA: Diagnosis not present

## 2023-02-21 DIAGNOSIS — H5203 Hypermetropia, bilateral: Secondary | ICD-10-CM | POA: Diagnosis not present

## 2023-02-21 DIAGNOSIS — H53143 Visual discomfort, bilateral: Secondary | ICD-10-CM | POA: Diagnosis not present

## 2023-02-21 DIAGNOSIS — H524 Presbyopia: Secondary | ICD-10-CM | POA: Diagnosis not present

## 2023-02-21 DIAGNOSIS — H35033 Hypertensive retinopathy, bilateral: Secondary | ICD-10-CM | POA: Diagnosis not present

## 2023-02-21 DIAGNOSIS — H3540 Unspecified peripheral retinal degeneration: Secondary | ICD-10-CM | POA: Diagnosis not present

## 2023-02-26 ENCOUNTER — Other Ambulatory Visit: Payer: Self-pay

## 2023-02-26 DIAGNOSIS — Z122 Encounter for screening for malignant neoplasm of respiratory organs: Secondary | ICD-10-CM

## 2023-02-26 DIAGNOSIS — Z87891 Personal history of nicotine dependence: Secondary | ICD-10-CM

## 2023-02-26 DIAGNOSIS — F17210 Nicotine dependence, cigarettes, uncomplicated: Secondary | ICD-10-CM

## 2023-03-14 DIAGNOSIS — M19071 Primary osteoarthritis, right ankle and foot: Secondary | ICD-10-CM | POA: Diagnosis not present

## 2023-03-16 ENCOUNTER — Other Ambulatory Visit: Payer: Self-pay

## 2023-03-16 MED ORDER — VALSARTAN 320 MG PO TABS: 320.0000 mg | ORAL_TABLET | Freq: Every day | ORAL | 0 refills | Status: DC

## 2023-03-21 ENCOUNTER — Other Ambulatory Visit: Payer: Self-pay

## 2023-03-21 DIAGNOSIS — I714 Abdominal aortic aneurysm, without rupture, unspecified: Secondary | ICD-10-CM

## 2023-03-21 DIAGNOSIS — F17200 Nicotine dependence, unspecified, uncomplicated: Secondary | ICD-10-CM

## 2023-03-21 DIAGNOSIS — E785 Hyperlipidemia, unspecified: Secondary | ICD-10-CM

## 2023-03-21 MED ORDER — ROSUVASTATIN CALCIUM 20 MG PO TABS: 20.0000 mg | ORAL_TABLET | Freq: Every day | ORAL | 0 refills | Status: DC

## 2023-04-13 ENCOUNTER — Ambulatory Visit: Payer: Medicare Other | Attending: Physician Assistant | Admitting: Physician Assistant

## 2023-04-13 ENCOUNTER — Encounter: Payer: Self-pay | Admitting: Physician Assistant

## 2023-04-13 VITALS — BP 120/78 | HR 57 | Ht 71.5 in | Wt 178.2 lb

## 2023-04-13 DIAGNOSIS — I2510 Atherosclerotic heart disease of native coronary artery without angina pectoris: Secondary | ICD-10-CM

## 2023-04-13 DIAGNOSIS — F17200 Nicotine dependence, unspecified, uncomplicated: Secondary | ICD-10-CM | POA: Diagnosis not present

## 2023-04-13 DIAGNOSIS — I10 Essential (primary) hypertension: Secondary | ICD-10-CM | POA: Diagnosis not present

## 2023-04-13 DIAGNOSIS — E785 Hyperlipidemia, unspecified: Secondary | ICD-10-CM | POA: Diagnosis not present

## 2023-04-13 DIAGNOSIS — I7143 Infrarenal abdominal aortic aneurysm, without rupture: Secondary | ICD-10-CM | POA: Diagnosis not present

## 2023-04-13 NOTE — Progress Notes (Signed)
Cardiology Office Note:  .   Date:  04/13/2023  ID:  Zachary Terry, DOB 1950-10-04, MRN [id number] PCP: Daisy Floro, MD   HeartCare Providers Cardiologist:  Meriam Sprague, MD (Inactive) {  History of Present Illness: .   Zachary Terry is a 72 y.o. male with a past medical history of tobacco use, HTN, HLD, AAA 16-year-old follow-up appointment.  Per review of records previous ultrasound of the abdominal aorta 09/30/2019 abdominal aortic aneurysm with maximum dimension of 0.4 cm.  Recommended follow-up ultrasound in 3 years.  Had CT cardiac scoring 12/05/2019 with calcium score of 769 placing him in the 52nd percentile for age and sex matched controls.  Dr. Delton See previously started him on rosuvastatin.  CT lung cancer screening 9/22 with findings of coronary artery disease, aortic atherosclerosis, emphysema, single small pulmonary nodule in the right upper lobe measuring 3 mm.  Recommend repeat CT in 1 year.  Was seen in clinic by Gillian Shields, NP 03/31/2021 and was doing great.  He was last seen 12/2021 by Dr. Shari Prows and at that time was feeling good.  However, had not had a good past month.  September 29 he contracted COVID and felt okay other than a slight cold but his wife had a fever and diverticulosis and spent 6 nights in the hospital.  She is since recovered but has been stressful for both of them.  No cardiovascular standpoint doing well with no chest pain, shortness breath, orthopnea, PND.  Well-controlled.  Tolerating medicines without issues.  Unfortunately he smokes 3 cigars a day and plans on quitting.  Compliant with medications.  Today, he presents with a history of aortic aneurysm, hypertension, and smoking, for a routine check-up. He reports overall good health, with the exception of a foot condition requiring surgery. 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 Despite the foot pain, the patient remains active, engaging in activities such as golf and yard work.  The patient also has a history of aortic aneurysm, which has been stable with no reported symptoms. Blood pressure has been well-controlled, and the patient is compliant with his valsartan and amlodipine regimen. The patient continues to smoke, albeit at a reduced rate of three cigars a day.  The patient's cholesterol levels are at goal, with an LDL of 70, and he is compliant with his rosuvastatin regimen.  Reports no shortness of breath nor dyspnea on exertion. Reports no chest pain, pressure, or tightness. No edema, orthopnea, PND. Reports no palpitations.    ROS: Pertinent ROS in HPI  Studies Reviewed: Marland Kitchen   EKG Interpretation Date/Time:  Friday April 13 2023 15:23:13 EDT Ventricular Rate:  57 PR Interval:  128 QRS Duration:  94 QT Interval:  412 QTC Calculation: 401 R Axis:   -81  Text Interpretation: Sinus bradycardia Left axis deviation Incomplete right bundle branch block Septal infarct , age undetermined No previous ECGs available Confirmed by Jari Favre [phone]) on 04/13/2023 3:34:48 PM     Echo 01/07/2020 IMPRESSIONS     1. Left ventricular ejection fraction, by estimation, is 50 to 55%. The  left ventricle has low normal function. The left ventricle has no regional  wall motion abnormalities. There is mild left ventricular hypertrophy.  Left ventricular diastolic  parameters were normal.   2. Right ventricular systolic function is normal. The right ventricular  size is normal. There is  normal pulmonary artery systolic pressure. The  estimated right ventricular systolic pressure is 28.0 mmHg.   3. The mitral valve is normal in structure. Trivial mitral valve  regurgitation.   4. The aortic valve is tricuspid. Aortic valve regurgitation is not  visualized. No aortic stenosis is present.   5. Aortic dilatation noted. There is mild  dilatation of the ascending  aorta measuring 37 mm.   6. The inferior vena cava is normal in size with greater than 50%  respiratory variability, suggesting right atrial pressure of 3 mmHg.       Physical Exam:   VS:  BP 120/78   Pulse (!) 57   Ht 5' 11.5" (1.816 m)   Wt 178 lb 3.2 oz (80.8 kg)   SpO2 98%   BMI 24.51 kg/m    Wt Readings from Last 3 Encounters:  04/13/23 178 lb 3.2 oz (80.8 kg)  04/11/22 175 lb 9.6 oz (79.7 kg)  04/08/21 180 lb 1.6 oz (81.7 kg)    GEN: Well nourished, well developed in no acute distress NECK: No JVD; No carotid bruits CARDIAC: RRR, no murmurs, rubs, gallops RESPIRATORY:  Clear to auscultation without rales, wheezing or rhonchi  ABDOMEN: Soft, non-tender, non-distended EXTREMITIES:  No edema; No deformity   ASSESSMENT AND PLAN: .    Aortic Aneurysm Minimally dilated (31mm) as of last echocardiogram in 2021. Blood pressure well controlled (120/78). - Continue current blood pressure management with valsartan and amlodipine. - No need for repeat echocardiogram at this time, consider updating in 1-2 years.  Hyperlipidemia LDL at goal (70) as of last labs in April 2024. - Continue rosuvastatin 20mg  nightly.  Tobacco Use Smoking 3 cigars daily - Encourage continued reduction and eventual cessation of tobacco use.  General Health Maintenance: - Next labs due in Spring 2025. - Patient to confirm if hydrochlorothiazide is still part of medication regimen. If not, remove from medication list. 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 The patient reports the shots are becoming less effective and is considering surgery. - Patient to continue current pain management plan until surgery.    Dispo: He can follow-up in a year with me.  Signed, Sharlene Dory, PA-C

## 2023-04-13 NOTE — Patient Instructions (Signed)
Medication Instructions:   Your physician recommends that you continue on your current medications as directed. Please refer to the Current Medication list given to you today.   *If you need a refill on your cardiac medications before your next appointment, please call your pharmacy*   Lab Work:  None ordered.  If you have labs (blood work) drawn today and your tests are completely normal, you will receive your results only by: MyChart Message (if you have MyChart) OR A paper copy in the mail If you have any lab test that is abnormal or we need to change your treatment, we will call you to review the results.   Testing/Procedures:  None ordered.   Follow-Up: At [facility], you and your health needs are our priority.  As part of our continuing mission to provide you with exceptional heart care, we have created designated Provider Care Teams.  These Care Teams include your primary Cardiologist (physician) and Advanced Practice Providers (APPs -  Physician Assistants and Nurse Practitioners) who all work together to provide you with the care you need, when you need it.  We recommend signing up for the patient portal called "MyChart".  Sign up information is provided on this After Visit Summary.  MyChart is used to connect with patients for Virtual Visits (Telemedicine).  Patients are able to view lab/test results, encounter notes, upcoming appointments, etc.  Non-urgent messages can be sent to your provider as well.   To learn more about what you can do with MyChart, go to [email or web address].    Your next appointment:   1 year(s)  Provider:   Jari Favre, PA-C         Other Instructions  Your physician wants you to follow-up in:  1 year.  You will receive a reminder letter in the mail two months in advance. If you don't receive a letter, please call our office to schedule the follow-up appointment.

## 2023-05-22 DIAGNOSIS — G4733 Obstructive sleep apnea (adult) (pediatric): Secondary | ICD-10-CM | POA: Diagnosis not present

## 2023-06-12 ENCOUNTER — Other Ambulatory Visit: Payer: Self-pay | Admitting: Family

## 2023-06-12 DIAGNOSIS — E785 Hyperlipidemia, unspecified: Secondary | ICD-10-CM

## 2023-06-12 DIAGNOSIS — I714 Abdominal aortic aneurysm, without rupture, unspecified: Secondary | ICD-10-CM

## 2023-06-12 DIAGNOSIS — F17200 Nicotine dependence, unspecified, uncomplicated: Secondary | ICD-10-CM

## 2023-06-18 DIAGNOSIS — M19071 Primary osteoarthritis, right ankle and foot: Secondary | ICD-10-CM | POA: Diagnosis not present

## 2023-06-18 DIAGNOSIS — M25571 Pain in right ankle and joints of right foot: Secondary | ICD-10-CM | POA: Diagnosis not present

## 2023-06-19 ENCOUNTER — Other Ambulatory Visit: Payer: Self-pay | Admitting: Orthopaedic Surgery

## 2023-06-19 DIAGNOSIS — M25571 Pain in right ankle and joints of right foot: Secondary | ICD-10-CM

## 2023-06-26 ENCOUNTER — Other Ambulatory Visit: Payer: Self-pay | Admitting: Family

## 2023-07-06 ENCOUNTER — Ambulatory Visit
Admission: RE | Admit: 2023-07-06 | Discharge: 2023-07-06 | Disposition: A | Discharge status: Home / Self Care | Payer: Medicare Other | Source: Ambulatory Visit | Attending: Orthopaedic Surgery | Admitting: Orthopaedic Surgery

## 2023-07-06 DIAGNOSIS — M25571 Pain in right ankle and joints of right foot: Secondary | ICD-10-CM | POA: Diagnosis not present

## 2023-07-06 DIAGNOSIS — M19071 Primary osteoarthritis, right ankle and foot: Secondary | ICD-10-CM | POA: Diagnosis not present

## 2023-07-18 DIAGNOSIS — M19071 Primary osteoarthritis, right ankle and foot: Secondary | ICD-10-CM | POA: Diagnosis not present

## 2023-09-27 DIAGNOSIS — I10 Essential (primary) hypertension: Secondary | ICD-10-CM | POA: Diagnosis not present

## 2023-09-27 DIAGNOSIS — E7800 Pure hypercholesterolemia, unspecified: Secondary | ICD-10-CM | POA: Diagnosis not present

## 2023-10-03 DIAGNOSIS — M19071 Primary osteoarthritis, right ankle and foot: Secondary | ICD-10-CM | POA: Diagnosis not present

## 2023-10-04 DIAGNOSIS — Z0000 Encounter for general adult medical examination without abnormal findings: Secondary | ICD-10-CM | POA: Diagnosis not present

## 2023-10-04 DIAGNOSIS — I10 Essential (primary) hypertension: Secondary | ICD-10-CM | POA: Diagnosis not present

## 2023-10-04 DIAGNOSIS — I714 Abdominal aortic aneurysm, without rupture, unspecified: Secondary | ICD-10-CM | POA: Diagnosis not present

## 2023-10-04 DIAGNOSIS — E7800 Pure hypercholesterolemia, unspecified: Secondary | ICD-10-CM | POA: Diagnosis not present

## 2023-10-04 DIAGNOSIS — J439 Emphysema, unspecified: Secondary | ICD-10-CM | POA: Diagnosis not present

## 2023-11-09 DIAGNOSIS — I714 Abdominal aortic aneurysm, without rupture, unspecified: Secondary | ICD-10-CM | POA: Diagnosis not present

## 2023-11-15 DIAGNOSIS — G4733 Obstructive sleep apnea (adult) (pediatric): Secondary | ICD-10-CM | POA: Diagnosis not present

## 2024-02-12 DIAGNOSIS — Z23 Encounter for immunization: Secondary | ICD-10-CM | POA: Diagnosis not present

## 2024-02-13 DIAGNOSIS — M79671 Pain in right foot: Secondary | ICD-10-CM | POA: Diagnosis not present

## 2024-02-13 DIAGNOSIS — M25571 Pain in right ankle and joints of right foot: Secondary | ICD-10-CM | POA: Diagnosis not present

## 2024-02-19 ENCOUNTER — Inpatient Hospital Stay
Admission: RE | Admit: 2024-02-19 | Discharge: 2024-02-19 | Disposition: A | Discharge status: Home / Self Care | Source: Ambulatory Visit | Attending: Family Medicine | Admitting: Family Medicine

## 2024-02-19 ENCOUNTER — Other Ambulatory Visit

## 2024-02-19 DIAGNOSIS — Z87891 Personal history of nicotine dependence: Secondary | ICD-10-CM

## 2024-02-19 DIAGNOSIS — Z122 Encounter for screening for malignant neoplasm of respiratory organs: Secondary | ICD-10-CM

## 2024-02-19 DIAGNOSIS — F17210 Nicotine dependence, cigarettes, uncomplicated: Secondary | ICD-10-CM

## 2024-03-06 ENCOUNTER — Telehealth: Payer: Self-pay | Admitting: Acute Care

## 2024-03-06 NOTE — Telephone Encounter (Signed)
 Returned call from VM.  Patient answered but could not hear me and hung up.  Returned call a second time and phone rang twice and disconnected.

## 2024-03-06 NOTE — Telephone Encounter (Signed)
 LR 2, 12 month follow up.  If he is having abdominal symptoms needs to follow up with PCP to evaluate. Fax results to PCP , and let them know 12 month follow up for screening, and that scan shows signs if illeus>>If patient is having s/s of ileus, please have them call PCP immediately.  Thanks so much Ladies   Lung-RADS 2, benign appearance or behavior. Continue annual screening with low-dose chest CT without contrast in 12 months. 2. Mild dilation of partially imaged left upper quadrant small bowel, which may reflect ileus. Recommend correlation with abdominal symptoms. 3. Aortic Atherosclerosis (ICD10-I70.0) and Emphysema (ICD10-J43.9). Coronary artery calcifications. Assessment for potential risk factor modification, dietary therapy or pharmacologic therapy may be warranted, if clinically indicated.

## 2024-03-06 NOTE — Telephone Encounter (Signed)
 LVM on home and cell phone to call office and review recent CT results.

## 2024-03-07 ENCOUNTER — Other Ambulatory Visit: Payer: Self-pay

## 2024-03-07 DIAGNOSIS — Z87891 Personal history of nicotine dependence: Secondary | ICD-10-CM

## 2024-03-07 DIAGNOSIS — Z122 Encounter for screening for malignant neoplasm of respiratory organs: Secondary | ICD-10-CM

## 2024-03-07 DIAGNOSIS — F17210 Nicotine dependence, cigarettes, uncomplicated: Secondary | ICD-10-CM

## 2024-03-07 NOTE — Telephone Encounter (Signed)
 I have called and spoke with the patient. He will complete an annual Lung CT again next year. Discussed symptoms of an ileus, he denies abdominal pain/discomfort, no bloating, no n/v, no diarrhea and he is having regular BM's. Pt will contact PCP immediately if symptoms develop. Annual Lung CT order placed.

## 2024-05-17 ENCOUNTER — Other Ambulatory Visit: Payer: Self-pay | Admitting: Family

## 2024-05-17 DIAGNOSIS — F17200 Nicotine dependence, unspecified, uncomplicated: Secondary | ICD-10-CM

## 2024-05-17 DIAGNOSIS — E785 Hyperlipidemia, unspecified: Secondary | ICD-10-CM

## 2024-05-17 DIAGNOSIS — I714 Abdominal aortic aneurysm, without rupture, unspecified: Secondary | ICD-10-CM

## 2024-06-04 ENCOUNTER — Other Ambulatory Visit: Payer: Self-pay | Admitting: Physician Assistant
